# Patient Record
Sex: Male | Born: 1985 | Race: White | Hispanic: No | Marital: Married | Smoking: Former smoker
Health system: Southern US, Community
[De-identification: ages and names within clinical notes are randomized; demographics above are authoritative.]

## PROBLEM LIST (undated history)

## (undated) DIAGNOSIS — R51 Headache: Secondary | ICD-10-CM

## (undated) DIAGNOSIS — R519 Headache, unspecified: Secondary | ICD-10-CM

## (undated) DIAGNOSIS — G4730 Sleep apnea, unspecified: Secondary | ICD-10-CM

## (undated) DIAGNOSIS — I639 Cerebral infarction, unspecified: Secondary | ICD-10-CM

## (undated) DIAGNOSIS — E785 Hyperlipidemia, unspecified: Secondary | ICD-10-CM

## (undated) DIAGNOSIS — G5603 Carpal tunnel syndrome, bilateral upper limbs: Secondary | ICD-10-CM

## (undated) DIAGNOSIS — T7840XA Allergy, unspecified, initial encounter: Secondary | ICD-10-CM

## (undated) DIAGNOSIS — K219 Gastro-esophageal reflux disease without esophagitis: Secondary | ICD-10-CM

## (undated) DIAGNOSIS — E119 Type 2 diabetes mellitus without complications: Secondary | ICD-10-CM

## (undated) DIAGNOSIS — I10 Essential (primary) hypertension: Secondary | ICD-10-CM

## (undated) DIAGNOSIS — G5710 Meralgia paresthetica, unspecified lower limb: Secondary | ICD-10-CM

## (undated) HISTORY — DX: Hyperlipidemia, unspecified: E78.5

## (undated) HISTORY — DX: Type 2 diabetes mellitus without complications: E11.9

## (undated) HISTORY — DX: Carpal tunnel syndrome, bilateral upper limbs: G56.03

## (undated) HISTORY — DX: Essential (primary) hypertension: I10

## (undated) HISTORY — DX: Cerebral infarction, unspecified: I63.9

## (undated) HISTORY — DX: Meralgia paresthetica, unspecified lower limb: G57.10

## (undated) HISTORY — DX: Allergy, unspecified, initial encounter: T78.40XA

---
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
## 2003-01-09 ENCOUNTER — Encounter: Admission: RE | Admit: 2003-01-09 | Discharge: 2003-01-09 | Payer: Self-pay | Admitting: Family Medicine

## 2003-03-28 ENCOUNTER — Encounter: Admission: RE | Admit: 2003-03-28 | Discharge: 2003-03-28 | Payer: Self-pay | Admitting: Family Medicine

## 2003-04-20 ENCOUNTER — Emergency Department (HOSPITAL_COMMUNITY): Admission: EM | Admit: 2003-04-20 | Discharge: 2003-04-20 | Payer: Self-pay | Admitting: Emergency Medicine

## 2003-05-17 ENCOUNTER — Encounter: Admission: RE | Admit: 2003-05-17 | Discharge: 2003-05-17 | Payer: Self-pay | Admitting: Family Medicine

## 2003-07-08 ENCOUNTER — Encounter: Admission: RE | Admit: 2003-07-08 | Discharge: 2003-07-08 | Payer: Self-pay | Admitting: Family Medicine

## 2003-07-31 ENCOUNTER — Encounter: Admission: RE | Admit: 2003-07-31 | Discharge: 2003-07-31 | Payer: Self-pay | Admitting: Family Medicine

## 2003-09-18 ENCOUNTER — Encounter: Admission: RE | Admit: 2003-09-18 | Discharge: 2003-09-18 | Payer: Self-pay | Admitting: Family Medicine

## 2003-11-08 ENCOUNTER — Encounter: Admission: RE | Admit: 2003-11-08 | Discharge: 2003-11-08 | Payer: Self-pay | Admitting: Family Medicine

## 2003-12-05 ENCOUNTER — Encounter: Admission: RE | Admit: 2003-12-05 | Discharge: 2003-12-05 | Payer: Self-pay | Admitting: Family Medicine

## 2004-04-24 ENCOUNTER — Encounter: Admission: RE | Admit: 2004-04-24 | Discharge: 2004-04-24 | Payer: Self-pay | Admitting: Family Medicine

## 2004-05-06 ENCOUNTER — Encounter: Admission: RE | Admit: 2004-05-06 | Discharge: 2004-05-06 | Payer: Self-pay | Admitting: Sports Medicine

## 2004-07-22 ENCOUNTER — Ambulatory Visit: Payer: Self-pay | Admitting: Family Medicine

## 2004-08-10 ENCOUNTER — Ambulatory Visit: Payer: Self-pay | Admitting: Family Medicine

## 2004-08-11 ENCOUNTER — Emergency Department (HOSPITAL_COMMUNITY): Admission: EM | Admit: 2004-08-11 | Discharge: 2004-08-11 | Payer: Self-pay | Admitting: Family Medicine

## 2004-08-14 ENCOUNTER — Emergency Department (HOSPITAL_COMMUNITY): Admission: EM | Admit: 2004-08-14 | Discharge: 2004-08-15 | Payer: Self-pay | Admitting: Emergency Medicine

## 2004-10-15 ENCOUNTER — Ambulatory Visit: Payer: Self-pay | Admitting: Family Medicine

## 2004-11-10 ENCOUNTER — Ambulatory Visit: Payer: Self-pay | Admitting: Family Medicine

## 2004-11-27 ENCOUNTER — Ambulatory Visit: Payer: Self-pay | Admitting: Family Medicine

## 2004-12-02 ENCOUNTER — Ambulatory Visit: Payer: Self-pay | Admitting: Family Medicine

## 2005-01-12 ENCOUNTER — Ambulatory Visit: Payer: Self-pay | Admitting: Family Medicine

## 2005-01-21 ENCOUNTER — Ambulatory Visit (HOSPITAL_COMMUNITY): Admission: RE | Admit: 2005-01-21 | Discharge: 2005-01-21 | Payer: Self-pay | Admitting: Family Medicine

## 2005-02-10 ENCOUNTER — Ambulatory Visit: Payer: Self-pay | Admitting: Family Medicine

## 2005-03-18 ENCOUNTER — Ambulatory Visit: Payer: Self-pay | Admitting: Family Medicine

## 2005-07-19 ENCOUNTER — Ambulatory Visit: Payer: Self-pay | Admitting: Family Medicine

## 2006-05-06 ENCOUNTER — Emergency Department (HOSPITAL_COMMUNITY): Admission: EM | Admit: 2006-05-06 | Discharge: 2006-05-06 | Payer: Self-pay | Admitting: Family Medicine

## 2006-06-21 ENCOUNTER — Ambulatory Visit: Payer: Self-pay | Admitting: Family Medicine

## 2006-07-05 ENCOUNTER — Ambulatory Visit: Payer: Self-pay | Admitting: Sports Medicine

## 2006-12-01 DIAGNOSIS — I10 Essential (primary) hypertension: Secondary | ICD-10-CM

## 2006-12-01 DIAGNOSIS — E669 Obesity, unspecified: Secondary | ICD-10-CM | POA: Insufficient documentation

## 2007-02-06 ENCOUNTER — Telehealth (INDEPENDENT_AMBULATORY_CARE_PROVIDER_SITE_OTHER): Payer: Self-pay | Admitting: *Deleted

## 2007-02-06 ENCOUNTER — Ambulatory Visit: Payer: Self-pay | Admitting: Family Medicine

## 2007-02-07 ENCOUNTER — Telehealth (INDEPENDENT_AMBULATORY_CARE_PROVIDER_SITE_OTHER): Payer: Self-pay | Admitting: *Deleted

## 2007-03-09 ENCOUNTER — Ambulatory Visit: Payer: Self-pay | Admitting: Family Medicine

## 2007-03-09 ENCOUNTER — Encounter (INDEPENDENT_AMBULATORY_CARE_PROVIDER_SITE_OTHER): Payer: Self-pay | Admitting: Family Medicine

## 2007-03-09 DIAGNOSIS — L909 Atrophic disorder of skin, unspecified: Secondary | ICD-10-CM | POA: Insufficient documentation

## 2007-03-09 DIAGNOSIS — L919 Hypertrophic disorder of the skin, unspecified: Secondary | ICD-10-CM

## 2007-03-09 LAB — CONVERTED CEMR LAB
BUN: 14 mg/dL (ref 6–23)
CO2: 26 meq/L (ref 19–32)
Calcium: 9.6 mg/dL (ref 8.4–10.5)
Chloride: 101 meq/L (ref 96–112)
Creatinine, Ser: 0.86 mg/dL (ref 0.40–1.50)
Glucose, Bld: 89 mg/dL (ref 70–99)
Potassium: 4.5 meq/L (ref 3.5–5.3)
Sodium: 139 meq/L (ref 135–145)

## 2007-06-06 ENCOUNTER — Ambulatory Visit: Payer: Self-pay | Admitting: Family Medicine

## 2007-06-06 ENCOUNTER — Telehealth (INDEPENDENT_AMBULATORY_CARE_PROVIDER_SITE_OTHER): Payer: Self-pay | Admitting: *Deleted

## 2007-06-06 ENCOUNTER — Encounter (INDEPENDENT_AMBULATORY_CARE_PROVIDER_SITE_OTHER): Payer: Self-pay | Admitting: Family Medicine

## 2007-07-13 ENCOUNTER — Ambulatory Visit: Payer: Self-pay | Admitting: Family Medicine

## 2007-07-13 ENCOUNTER — Encounter: Payer: Self-pay | Admitting: *Deleted

## 2007-07-13 DIAGNOSIS — B86 Scabies: Secondary | ICD-10-CM

## 2007-08-11 ENCOUNTER — Emergency Department (HOSPITAL_COMMUNITY): Admission: EM | Admit: 2007-08-11 | Discharge: 2007-08-11 | Payer: Self-pay | Admitting: Family Medicine

## 2007-12-12 ENCOUNTER — Telehealth: Payer: Self-pay | Admitting: *Deleted

## 2008-01-15 ENCOUNTER — Ambulatory Visit: Payer: Self-pay | Admitting: Sports Medicine

## 2008-01-15 ENCOUNTER — Encounter (INDEPENDENT_AMBULATORY_CARE_PROVIDER_SITE_OTHER): Payer: Self-pay | Admitting: Family Medicine

## 2008-01-15 DIAGNOSIS — E785 Hyperlipidemia, unspecified: Secondary | ICD-10-CM | POA: Insufficient documentation

## 2008-01-15 LAB — CONVERTED CEMR LAB
Bilirubin Urine: NEGATIVE
Blood in Urine, dipstick: NEGATIVE
Ketones, urine, test strip: NEGATIVE
Nitrite: NEGATIVE
Urobilinogen, UA: 0.2

## 2008-01-16 LAB — CONVERTED CEMR LAB
ALT: 28 units/L (ref 0–53)
Albumin: 4.7 g/dL (ref 3.5–5.2)
Alkaline Phosphatase: 78 units/L (ref 39–117)
CO2: 24 meq/L (ref 19–32)
Glucose, Bld: 91 mg/dL (ref 70–99)
LDL Cholesterol: 178 mg/dL — ABNORMAL HIGH (ref 0–99)
Potassium: 4.5 meq/L (ref 3.5–5.3)
Pro B Natriuretic peptide (BNP): 19 pg/mL (ref 0.0–100.0)
Sodium: 140 meq/L (ref 135–145)
Total Bilirubin: 0.5 mg/dL (ref 0.3–1.2)
Total Protein: 7.7 g/dL (ref 6.0–8.3)
Triglycerides: 161 mg/dL — ABNORMAL HIGH (ref <150)
VLDL: 32 mg/dL (ref 0–40)

## 2008-02-01 ENCOUNTER — Ambulatory Visit: Payer: Self-pay | Admitting: Family Medicine

## 2008-02-02 ENCOUNTER — Encounter (INDEPENDENT_AMBULATORY_CARE_PROVIDER_SITE_OTHER): Payer: Self-pay | Admitting: Family Medicine

## 2008-02-23 ENCOUNTER — Telehealth: Payer: Self-pay | Admitting: *Deleted

## 2008-03-13 ENCOUNTER — Encounter (INDEPENDENT_AMBULATORY_CARE_PROVIDER_SITE_OTHER): Payer: Self-pay | Admitting: Family Medicine

## 2008-03-13 ENCOUNTER — Ambulatory Visit: Payer: Self-pay | Admitting: Family Medicine

## 2008-03-14 LAB — CONVERTED CEMR LAB
Calcium: 9.7 mg/dL (ref 8.4–10.5)
Direct LDL: 196 mg/dL — ABNORMAL HIGH
Glucose, Bld: 87 mg/dL (ref 70–99)
Potassium: 4.1 meq/L (ref 3.5–5.3)
Sodium: 140 meq/L (ref 135–145)

## 2008-04-02 ENCOUNTER — Telehealth (INDEPENDENT_AMBULATORY_CARE_PROVIDER_SITE_OTHER): Payer: Self-pay | Admitting: *Deleted

## 2008-04-11 ENCOUNTER — Encounter (INDEPENDENT_AMBULATORY_CARE_PROVIDER_SITE_OTHER): Payer: Self-pay | Admitting: Family Medicine

## 2008-04-22 ENCOUNTER — Telehealth (INDEPENDENT_AMBULATORY_CARE_PROVIDER_SITE_OTHER): Payer: Self-pay | Admitting: *Deleted

## 2008-05-22 ENCOUNTER — Telehealth: Payer: Self-pay | Admitting: *Deleted

## 2008-05-23 ENCOUNTER — Ambulatory Visit: Payer: Self-pay | Admitting: Family Medicine

## 2008-06-12 ENCOUNTER — Telehealth: Payer: Self-pay | Admitting: *Deleted

## 2008-06-12 ENCOUNTER — Ambulatory Visit: Payer: Self-pay | Admitting: Family Medicine

## 2008-06-24 ENCOUNTER — Ambulatory Visit: Payer: Self-pay | Admitting: Family Medicine

## 2008-09-19 ENCOUNTER — Emergency Department (HOSPITAL_COMMUNITY): Admission: EM | Admit: 2008-09-19 | Discharge: 2008-09-20 | Payer: Self-pay | Admitting: Emergency Medicine

## 2008-09-21 ENCOUNTER — Telehealth: Payer: Self-pay | Admitting: Family Medicine

## 2008-09-23 ENCOUNTER — Ambulatory Visit: Payer: Self-pay | Admitting: Family Medicine

## 2009-07-12 ENCOUNTER — Encounter (INDEPENDENT_AMBULATORY_CARE_PROVIDER_SITE_OTHER): Payer: Self-pay | Admitting: *Deleted

## 2009-07-12 DIAGNOSIS — F17200 Nicotine dependence, unspecified, uncomplicated: Secondary | ICD-10-CM | POA: Insufficient documentation

## 2009-07-15 ENCOUNTER — Encounter: Payer: Self-pay | Admitting: Family Medicine

## 2009-07-15 ENCOUNTER — Ambulatory Visit: Payer: Self-pay | Admitting: Family Medicine

## 2009-07-15 LAB — CONVERTED CEMR LAB
Bilirubin Urine: NEGATIVE
Blood in Urine, dipstick: NEGATIVE
Glucose, Urine, Semiquant: NEGATIVE
Ketones, urine, test strip: NEGATIVE
Specific Gravity, Urine: 1.02
pH: 7

## 2009-07-21 ENCOUNTER — Encounter: Payer: Self-pay | Admitting: Family Medicine

## 2009-07-21 LAB — CONVERTED CEMR LAB
ALT: 33 units/L (ref 0–53)
AST: 22 units/L (ref 0–37)
Albumin: 4.7 g/dL (ref 3.5–5.2)
Alkaline Phosphatase: 75 units/L (ref 39–117)
Glucose, Bld: 78 mg/dL (ref 70–99)
LDL Cholesterol: 184 mg/dL — ABNORMAL HIGH (ref 0–99)
Potassium: 4.5 meq/L (ref 3.5–5.3)
Sodium: 137 meq/L (ref 135–145)
Total Bilirubin: 0.4 mg/dL (ref 0.3–1.2)
Total Protein: 7.6 g/dL (ref 6.0–8.3)
Triglycerides: 203 mg/dL — ABNORMAL HIGH (ref <150)
VLDL: 41 mg/dL — ABNORMAL HIGH (ref 0–40)

## 2009-12-23 ENCOUNTER — Ambulatory Visit: Payer: Self-pay | Admitting: Family Medicine

## 2010-09-23 ENCOUNTER — Emergency Department (HOSPITAL_COMMUNITY)
Admission: EM | Admit: 2010-09-23 | Discharge: 2010-09-23 | Payer: Self-pay | Source: Home / Self Care | Admitting: Emergency Medicine
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
## 2011-02-19 NOTE — Op Note (Signed)
[place]. [facility]  Patient:    Jesse Cook                           MRN: [id number] Proc. Date: 08/19/00 Adm. Date:  [id number] Disc. Date: [id number] Attending:  Annamarie Dawley                           Operative Report  PREOPERATIVE DIAGNOSIS:  Persistent firm right submental lymph node.  POSTOPERATIVE DIAGNOSIS:  Excision right deep submental lymph node.  POSTOPERATIVE DIAGNOSIS:  Pending histological evaluation.  DESCRIPTION OF PROCEDURE:  With the patient under general orotracheal anesthesia, neck, lower face, upper chest was prepped and draped in a sterile fashion.  The patient had firm, freely mobile, palpable 1.5-2 cm node to the right of the midline superior to the hyoid in the submental area.  Midline free margin and mandible were marked and a horizontal incision parallel to the free margin of the mandible was marked and made through skin and subcutaneous tissue overlying the palpable nodule.  An incision carried through significant subcutaneous fat and through the mylohyoid muscle with the node being deep to mylohyoid.  A smooth-surfaced, pale tan node was encountered and dissected free and removed.  Hemostasis kept complete with 4-0 silk ties and electrocautery as indicated.  The wound was then closed in layers with interrupted 4-0 chromic catgut and skin approximated with a running 4-0 nylon subcuticular closure.  Skin around the incision was painted with benzoin and a Steri-Strip applied.  Blood loss for the procedure was less than 30 cc.  The patient tolerated the procedure well and was taken to the recovery room in stable general condition. DD:  08/19/00 TD:  08/19/00 Job: [id number]

## 2011-07-08 LAB — POCT I-STAT, CHEM 8
BUN: 10 mg/dL (ref 6–23)
Calcium, Ion: 1.07 mmol/L — ABNORMAL LOW (ref 1.12–1.32)
Chloride: 103 mEq/L (ref 96–112)
HCT: 45 % (ref 39.0–52.0)
Sodium: 135 mEq/L (ref 135–145)

## 2011-07-22 ENCOUNTER — Ambulatory Visit (INDEPENDENT_AMBULATORY_CARE_PROVIDER_SITE_OTHER): Payer: Managed Care, Other (non HMO) | Admitting: Family Medicine

## 2011-07-22 VITALS — BP 180/126 | HR 68 | Temp 97.5°F | Ht 66.0 in | Wt 298.3 lb

## 2011-07-22 DIAGNOSIS — M545 Low back pain, unspecified: Secondary | ICD-10-CM

## 2011-07-22 DIAGNOSIS — R209 Unspecified disturbances of skin sensation: Secondary | ICD-10-CM

## 2011-07-22 DIAGNOSIS — R202 Paresthesia of skin: Secondary | ICD-10-CM

## 2011-07-22 DIAGNOSIS — M79609 Pain in unspecified limb: Secondary | ICD-10-CM | POA: Insufficient documentation

## 2011-07-22 DIAGNOSIS — Z23 Encounter for immunization: Secondary | ICD-10-CM

## 2011-07-22 DIAGNOSIS — E785 Hyperlipidemia, unspecified: Secondary | ICD-10-CM

## 2011-07-22 DIAGNOSIS — I10 Essential (primary) hypertension: Secondary | ICD-10-CM

## 2011-07-22 MED ORDER — METOPROLOL TARTRATE 25 MG PO TABS: 25.0000 mg | ORAL_TABLET | Freq: Two times a day (BID) | ORAL | Status: DC

## 2011-07-22 MED ORDER — SIMVASTATIN 40 MG PO TABS: 40.0000 mg | ORAL_TABLET | Freq: Every day | ORAL | Status: DC

## 2011-07-22 NOTE — Assessment & Plan Note (Signed)
Mild intermittent low back pain that is not requiring medications at this time. Advised patient of how to properly use Tylenol with intermittent use of ibuprofen as needed. Discussed the long-term goal of weight loss and exercise to help prevent further complications. Discussed role of physical therapy in the future.

## 2011-07-22 NOTE — Progress Notes (Signed)
  Subjective:    Patient ID: Jesse Cook, male    DOB: Jul 23, 1986, 25 y.o.   MRN: [id number]  HPI patient is a 25 year old male who presents for a same-day appointment for one week of left thigh numbness and tingling.  Left thigh numbness: Patient states onset about a week ago with no known trauma, changes in physical activity. He describes it as not so much a pain but more as a numbness and tingling over the antero-lateral surface of his left thigh. He denies any weakness, redness, edema. He slso notes a history of low back pain and wonders if this could be related.    hypertension: At today's visit it was noted that patient's blood pressure was markedly elevated. Patient states this is also true for home blood pressure readings. He has been taking his lisinopril hydrochlorothiazide regularly. He denies any chest pain or dyspnea.  Low back pain: Patient staandtes chronic intermittent low back pain without any radiation or radiculopathy. He has never been evaluated for this before and does not take any regular medications for this. Review of Systems see history of present illnes  I have reviewed patient's  PMH, FH, and Social history and Medications as related to this visit.     Objective:   Physical Exam GEN: Alert & Oriented, No acute distress CV:  Regular Rate & Rhythm, no murmur Respiratory:  Normal work of breathing, CTAB Abd:  + BS, soft, no tenderness to palpation Ext: no pre-tibial edema Right thigh:  paraesthesias to light touch over left anterolateral surface of leg.  No edema or erythema. Back Exam:  Mild pain lumbar paraspinal.  Neg faber.  Neg straight leg raise. NEURO:  Patellar reflexes 2+ bilaterally.        Assessment & Plan:

## 2011-07-22 NOTE — Assessment & Plan Note (Addendum)
Will add metoprolol today for very elevated blood pressure. Patient will see his PCP in 1-2 weeks for further titration

## 2011-07-22 NOTE — Patient Instructions (Signed)
The leg numbness you are noticing can be coming from meralgia parasthetica: nerve compression and irrtation.  This generally resolves on its own.  Try to avoid tight fitting clothes, losing weight can also help.  I think it is less likely this is coming from your back.  Consider back exercises, physical therapy to help prevent ongoing back problems  Will add a blood pressure medicine today: metoprolol  Keep follow-up with PCp in 1-2 weeks

## 2011-07-22 NOTE — Assessment & Plan Note (Signed)
I have refilled his statin today. He will see his PCP for followup and lab work.

## 2011-07-22 NOTE — Assessment & Plan Note (Signed)
I suspect this is meralgia paresthetica, no clothign restriction, risk factors of obesity.  Advised monitoring for now.  Less likely related to lowback pain.

## 2011-07-29 ENCOUNTER — Encounter: Payer: Self-pay | Admitting: Family Medicine

## 2011-07-29 ENCOUNTER — Ambulatory Visit (INDEPENDENT_AMBULATORY_CARE_PROVIDER_SITE_OTHER): Payer: Managed Care, Other (non HMO) | Admitting: Family Medicine

## 2011-07-29 VITALS — BP 159/97 | HR 90 | Ht 64.75 in | Wt 300.3 lb

## 2011-07-29 DIAGNOSIS — I10 Essential (primary) hypertension: Secondary | ICD-10-CM

## 2011-07-29 DIAGNOSIS — F17200 Nicotine dependence, unspecified, uncomplicated: Secondary | ICD-10-CM

## 2011-07-29 DIAGNOSIS — R202 Paresthesia of skin: Secondary | ICD-10-CM

## 2011-07-29 DIAGNOSIS — R209 Unspecified disturbances of skin sensation: Secondary | ICD-10-CM

## 2011-07-29 DIAGNOSIS — Z0000 Encounter for general adult medical examination without abnormal findings: Secondary | ICD-10-CM

## 2011-07-29 MED ORDER — NORTRIPTYLINE HCL 25 MG PO CAPS: 25.0000 mg | ORAL_CAPSULE | Freq: Every day | ORAL | Status: DC

## 2011-07-29 NOTE — Assessment & Plan Note (Signed)
Uses dip. Currently unwilling to quit

## 2011-07-29 NOTE — Assessment & Plan Note (Signed)
Obese hypertensive and oral tobacco user. These problems are discussed separately. Health maintenance and historically information reviewed and updated. Plan to work on weight loss aggressively this year. Followup in one month

## 2011-07-29 NOTE — Patient Instructions (Signed)
Thank you for coming in today. Start taking both the Metoporol and lisinopril/HCTZ Take the nortriptline at night. It will make you sleepy and my have a dry mouth at first.  This will start to help your leg.  I would recommend suspenders.  Get an appointment with Arlys John for weight loss discussion. This is not an additional cost to you as you need to see the doctor a bit at first.  See me in 1 month.

## 2011-07-29 NOTE — Progress Notes (Signed)
Jesse Cook  presents to clinic today for a well adult visit. He was seen by Dr. Earnest Cook 2 weeks ago for hypertension and lateral femoral cutaneous nerve syndrome. In the interim he got confused and stopped taking his lisinopril hydrochlorothiazide and started taking metoprolol 25 2 times a day. He denies any chest pain syncope palpitations dyspnea on exertion or edema.  He does note that he continues to have painful tingly numb sensation on the lateral/anterior aspect of his left thigh. It is quite bothersome for him. He notes that it often interferes with his ability to fall asleep.  He denies any weakness, bowel or bladder dysfunction.  PMH, surgical history, social history, family history reviewed and updated. ROS as above otherwise neg Medications reviewed.  Health maintenance reviewed and updated.  Exam:  BP 159/97  Pulse 90  Ht 5' 4.75" (1.645 m)  Wt 300 lb 4.8 oz (136.215 kg)  BMI 50.36 kg/m2 Gen: Well NAD HEENT: EOMI,  MMM, large neck Lungs: CTABL Nl WOB Heart: RRR no MRG Abd: NABS, NT, ND, obese. Pains are tight at the belt Exts: Non edematous BL  LE, warm and well perfused.  Neuro: Alert and oriented x3. Decreased sensation on the left lateral thigh. Normal reflexes in patellar, and ankles bilaterally. Normal gait. Able to get on exam table by himself.

## 2011-07-29 NOTE — Assessment & Plan Note (Signed)
Continues to be elevated. Currently only on metoprolol due to some confusion. Plan to add back lisinopril/hydrochlorothiazide. Followup in one month

## 2011-07-29 NOTE — Assessment & Plan Note (Signed)
Quite bothersome for Jesse Cook. Advice of behavior modification such as using suspenders losing weight.   I will treat with nortriptyline at night. If not improving will increase dose. Followup in one month

## 2011-08-30 ENCOUNTER — Ambulatory Visit: Payer: Managed Care, Other (non HMO) | Admitting: Family Medicine

## 2011-10-22 ENCOUNTER — Telehealth: Payer: Self-pay | Admitting: Family Medicine

## 2011-10-22 DIAGNOSIS — E669 Obesity, unspecified: Secondary | ICD-10-CM

## 2011-10-22 NOTE — Telephone Encounter (Signed)
Jesse Cook came to the visit today with his wife.  He notes that he'll stop breathing in the middle of the night and is ready to get a sleep study.

## 2011-11-19 ENCOUNTER — Encounter (HOSPITAL_BASED_OUTPATIENT_CLINIC_OR_DEPARTMENT_OTHER): Payer: Managed Care, Other (non HMO)

## 2011-12-24 ENCOUNTER — Ambulatory Visit (HOSPITAL_BASED_OUTPATIENT_CLINIC_OR_DEPARTMENT_OTHER): Payer: 59

## 2011-12-24 ENCOUNTER — Ambulatory Visit (HOSPITAL_BASED_OUTPATIENT_CLINIC_OR_DEPARTMENT_OTHER): Payer: Managed Care, Other (non HMO)

## 2011-12-27 ENCOUNTER — Other Ambulatory Visit: Payer: Self-pay

## 2011-12-27 ENCOUNTER — Emergency Department (HOSPITAL_COMMUNITY)
Admission: EM | Admit: 2011-12-27 | Discharge: 2011-12-27 | Disposition: A | Discharge status: Home / Self Care | Payer: Self-pay | Attending: Emergency Medicine | Admitting: Emergency Medicine

## 2011-12-27 ENCOUNTER — Emergency Department (HOSPITAL_COMMUNITY): Payer: Self-pay

## 2011-12-27 DIAGNOSIS — I10 Essential (primary) hypertension: Secondary | ICD-10-CM | POA: Insufficient documentation

## 2011-12-27 DIAGNOSIS — R112 Nausea with vomiting, unspecified: Secondary | ICD-10-CM | POA: Insufficient documentation

## 2011-12-27 DIAGNOSIS — R0789 Other chest pain: Secondary | ICD-10-CM

## 2011-12-27 DIAGNOSIS — R071 Chest pain on breathing: Secondary | ICD-10-CM | POA: Insufficient documentation

## 2011-12-27 DIAGNOSIS — R079 Chest pain, unspecified: Secondary | ICD-10-CM | POA: Insufficient documentation

## 2011-12-27 DIAGNOSIS — Z79899 Other long term (current) drug therapy: Secondary | ICD-10-CM | POA: Insufficient documentation

## 2011-12-27 DIAGNOSIS — R42 Dizziness and giddiness: Secondary | ICD-10-CM | POA: Insufficient documentation

## 2011-12-27 DIAGNOSIS — E785 Hyperlipidemia, unspecified: Secondary | ICD-10-CM | POA: Insufficient documentation

## 2011-12-27 LAB — DIFFERENTIAL
Basophils Absolute: 0 10*3/uL (ref 0.0–0.1)
Eosinophils Relative: 2 % (ref 0–5)
Lymphocytes Relative: 22 % (ref 12–46)
Lymphs Abs: 1.6 10*3/uL (ref 0.7–4.0)
Monocytes Absolute: 0.5 10*3/uL (ref 0.1–1.0)
Neutro Abs: 5 10*3/uL (ref 1.7–7.7)

## 2011-12-27 LAB — POCT I-STAT, CHEM 8
BUN: 11 mg/dL (ref 6–23)
Calcium, Ion: 1.29 mmol/L (ref 1.12–1.32)
Hemoglobin: 15 g/dL (ref 13.0–17.0)
Sodium: 139 mEq/L (ref 135–145)
TCO2: 28 mmol/L (ref 0–100)

## 2011-12-27 LAB — CBC
HCT: 43.5 % (ref 39.0–52.0)
MCV: 86 fL (ref 78.0–100.0)
Platelets: 199 10*3/uL (ref 150–400)
RBC: 5.06 MIL/uL (ref 4.22–5.81)
RDW: 12.5 % (ref 11.5–15.5)
WBC: 7.3 10*3/uL (ref 4.0–10.5)

## 2011-12-27 LAB — CARDIAC PANEL(CRET KIN+CKTOT+MB+TROPI)
Relative Index: 1.7 (ref 0.0–2.5)
Total CK: 151 U/L (ref 7–232)
Troponin I: <0.3 ng/mL (ref <0.30)

## 2011-12-27 MED ORDER — SODIUM CHLORIDE 0.9 % IV SOLN
Freq: Once | INTRAVENOUS | Status: AC
  Administered 2011-12-27: 03:00:00 via INTRAVENOUS

## 2011-12-27 MED ORDER — PANTOPRAZOLE SODIUM 40 MG IV SOLR
40.0000 mg | Freq: Once | INTRAVENOUS | Status: AC
  Administered 2011-12-27: 40 mg via INTRAVENOUS
  Filled 2011-12-27: qty 40

## 2011-12-27 MED ORDER — ASPIRIN 81 MG PO CHEW
324.0000 mg | CHEWABLE_TABLET | Freq: Once | ORAL | Status: AC
  Administered 2011-12-27: 324 mg via ORAL
  Filled 2011-12-27: qty 4

## 2011-12-27 NOTE — ED Provider Notes (Signed)
History     CSN: [id number]  Arrival date & time 12/27/11  0009   First MD Initiated Contact with Patient 12/27/11 0130      Chief Complaint  Patient presents with  . Chest Pain    25 minutes ago,  nausea vomiting,  pain shooting down left arm, history htn,  high cholesterol    (Consider location/radiation/quality/duration/timing/severity/associated sxs/prior treatment) HPI Comments: Patient states he's been out of his blood pressure medicine for 2 weeks.  He took one dose of metoprolol tonight while he was sitting in a recliner.  He became dizzy and nauseated.  His wife helped him to the bedroom proceeded to run through resting and vomited this did not relieve his symptoms of nausea, chest pain, and dizziness  The history is provided by the patient.    Past Medical History  Diagnosis Date  . Hypertension   . Hyperlipidemia   . Lateral femoral cutaneous neuropathy     Past Surgical History  Procedure Date  . Lymph node biopsy     Family History  Problem Relation Age of Onset  . Diabetes Mother   . COPD Mother   . Heart disease Father   . Hypertension Father   . Diabetes Father   . Hyperlipidemia Father   . Heart disease Brother   . Kidney disease Brother     History  Substance Use Topics  . Smoking status: Former Games developer  . Smokeless tobacco: Current User    Last Attempt to Quit: 07/28/2005  . Alcohol Use: 2.4 oz/week    4 Cans of beer per week      Review of Systems  Constitutional: Negative for fever.  HENT: Negative for congestion.   Respiratory: Negative for cough and shortness of breath.   Cardiovascular: Positive for chest pain. Negative for palpitations and leg swelling.  Gastrointestinal: Positive for nausea and vomiting.  Neurological: Positive for dizziness. Negative for weakness.    Allergies  Review of patient's allergies indicates no known allergies.  Home Medications   Current Outpatient Rx  Name Route Sig Dispense Refill  .  LISINOPRIL-HYDROCHLOROTHIAZIDE 20-25 MG PO TABS Oral Take 1 tablet by mouth daily. for blood pressure and leg swelling     . METOPROLOL TARTRATE 25 MG PO TABS Oral Take 1 tablet (25 mg total) by mouth 2 (two) times daily. 60 tablet 0  . OMEPRAZOLE 20 MG PO TBEC Oral Take 1 tablet by mouth every morning.     Marland Kitchen SIMVASTATIN 40 MG PO TABS Oral Take 1 tablet (40 mg total) by mouth daily. 30 tablet 5    BP 168/103  Pulse 73  Temp(Src) 98.8 F (37.1 C) (Oral)  Resp 22  SpO2 100%  Physical Exam  Constitutional: He appears well-developed and well-nourished.  HENT:  Head: Normocephalic.  Neck: Normal range of motion.  Cardiovascular: Normal rate and regular rhythm.   Pulmonary/Chest: Effort normal and breath sounds normal. No accessory muscle usage. No respiratory distress. He has no decreased breath sounds. He has no wheezes. He exhibits tenderness.       Reproducible pain across upper chest just under her clavicles bilaterally  Abdominal: Soft. There is no tenderness.  Musculoskeletal: Normal range of motion.  Neurological: He is alert.  Skin: Skin is warm and dry. No pallor.    ED Course  Procedures (including critical care time)   Labs Reviewed  CBC  DIFFERENTIAL  CARDIAC PANEL(CRET KIN+CKTOT+MB+TROPI)   No results found.   No diagnosis found.  ED ECG  REPORT   Date: 12/27/2011  EKG Time: 1:48 AM  Rate: 73  Rhythm: normal sinus rhythm,  there are no previous tracings available for comparison  Axis: right  Intervals:none  ST&T Change: none  Narrative Interpretation: normal             MDM  .This is cardiac in nature.  Patient does have a history of GERD.  He did take one dose of his antihypertensive just before he became dizzy had been off these medicines for almost 2 weeks before refilling his prescription        Arman Filter, NP 12/27/11 [phone]

## 2011-12-27 NOTE — ED Notes (Signed)
Patient given discharge instructions, information, prescriptions, and diet order. Patient states that they adequately understand discharge information given and to return to ED if symptoms return or worsen.    Pt informed to follow up with regular MD regarding cardiac hx and new symptoms.

## 2011-12-27 NOTE — Discharge Instructions (Signed)
Chest Pain (Nonspecific) It is often hard to give a specific diagnosis for the cause of chest pain. There is always a chance that your pain could be related to something serious, such as a heart attack or a blood clot in the lungs. You need to follow up with your caregiver for further evaluation. CAUSES   Heartburn.   Pneumonia or bronchitis.   Anxiety or stress.   Inflammation around your heart (pericarditis) or lung (pleuritis or pleurisy).   A blood clot in the lung.   A collapsed lung (pneumothorax). It can develop suddenly on its own (spontaneous pneumothorax) or from injury (trauma) to the chest.   Shingles infection (herpes zoster virus).  The chest wall is composed of bones, muscles, and cartilage. Any of these can be the source of the pain.  The bones can be bruised by injury.   The muscles or cartilage can be strained by coughing or overwork.   The cartilage can be affected by inflammation and become sore (costochondritis).  DIAGNOSIS  Lab tests or other studies, such as X-rays, electrocardiography, stress testing, or cardiac imaging, may be needed to find the cause of your pain.  TREATMENT   Treatment depends on what may be causing your chest pain. Treatment may include:   Acid blockers for heartburn.   Anti-inflammatory medicine.   Pain medicine for inflammatory conditions.   Antibiotics if an infection is present.   You may be advised to change lifestyle habits. This includes stopping smoking and avoiding alcohol, caffeine, and chocolate.   You may be advised to keep your head raised (elevated) when sleeping. This reduces the chance of acid going backward from your stomach into your esophagus.   Most of the time, nonspecific chest pain will improve within 2 to 3 days with rest and mild pain medicine.  HOME CARE INSTRUCTIONS   If antibiotics were prescribed, take your antibiotics as directed. Finish them even if you start to feel better.   For the next few  days, avoid physical activities that bring on chest pain. Continue physical activities as directed.   Do not smoke.   Avoid drinking alcohol.   Only take over-the-counter or prescription medicine for pain, discomfort, or fever as directed by your caregiver.   Follow your caregiver's suggestions for further testing if your chest pain does not go away.   Keep any follow-up appointments you made. If you do not go to an appointment, you could develop lasting (chronic) problems with pain. If there is any problem keeping an appointment, you must call to reschedule.  SEEK MEDICAL CARE IF:   You think you are having problems from the medicine you are taking. Read your medicine instructions carefully.   Your chest pain does not go away, even after treatment.   You develop a rash with blisters on your chest.  SEEK IMMEDIATE MEDICAL CARE IF:   You have increased chest pain or pain that spreads to your arm, neck, jaw, back, or abdomen.   You develop shortness of breath, an increasing cough, or you are coughing up blood.   You have severe back or abdominal pain, feel nauseous, or vomit.   You develop severe weakness, fainting, or chills.   You have a fever.  THIS IS AN EMERGENCY. Do not wait to see if the pain will go away. Get medical help at once. Call your local emergency services (911 in [place] Do not drive yourself to the hospital. MAKE SURE YOU:   Understand these instructions.     Will watch your condition.   Will get help right away if you are not doing well or get worse.  Document Released: 06/30/2005 Document Revised: 09/09/2011 Document Reviewed: 04/25/2008 [employer] Patient Information 2012 [employer]. 

## 2011-12-27 NOTE — ED Provider Notes (Signed)
Medical screening examination/treatment/procedure(s) were performed by non-physician practitioner and as supervising physician I was immediately available for consultation/collaboration.   Vida Roller, MD 12/27/11 [phone]

## 2011-12-27 NOTE — ED Notes (Signed)
Pt states he was laying in recliner and started getting dizzy, wife assisted him to bed and his chest started hurting he vomited and pain shot down left arm,

## 2012-01-03 ENCOUNTER — Other Ambulatory Visit (HOSPITAL_COMMUNITY): Payer: Self-pay | Admitting: Cardiovascular Disease

## 2012-01-10 ENCOUNTER — Ambulatory Visit (HOSPITAL_COMMUNITY)
Admission: RE | Admit: 2012-01-10 | Discharge: 2012-01-10 | Disposition: A | Discharge status: Home / Self Care | Payer: 59 | Source: Ambulatory Visit | Attending: Cardiovascular Disease | Admitting: Cardiovascular Disease

## 2012-01-10 ENCOUNTER — Encounter (HOSPITAL_COMMUNITY)
Admission: RE | Admit: 2012-01-10 | Discharge: 2012-01-10 | Disposition: A | Discharge status: Home / Self Care | Payer: 59 | Source: Ambulatory Visit | Attending: Cardiovascular Disease | Admitting: Cardiovascular Disease

## 2012-01-10 DIAGNOSIS — R079 Chest pain, unspecified: Secondary | ICD-10-CM | POA: Insufficient documentation

## 2012-01-10 DIAGNOSIS — R0602 Shortness of breath: Secondary | ICD-10-CM | POA: Insufficient documentation

## 2012-01-10 DIAGNOSIS — E785 Hyperlipidemia, unspecified: Secondary | ICD-10-CM | POA: Insufficient documentation

## 2012-01-10 DIAGNOSIS — I10 Essential (primary) hypertension: Secondary | ICD-10-CM | POA: Insufficient documentation

## 2012-01-10 DIAGNOSIS — F17200 Nicotine dependence, unspecified, uncomplicated: Secondary | ICD-10-CM | POA: Insufficient documentation

## 2012-01-10 DIAGNOSIS — E669 Obesity, unspecified: Secondary | ICD-10-CM | POA: Insufficient documentation

## 2012-01-10 MED ORDER — REGADENOSON 0.4 MG/5ML IV SOLN
INTRAVENOUS | Status: AC
  Filled 2012-01-10: qty 5

## 2012-01-10 MED ORDER — TECHNETIUM TC 99M TETROFOSMIN IV KIT
10.0000 | PACK | Freq: Once | INTRAVENOUS | Status: AC | PRN
  Administered 2012-01-10: 10 via INTRAVENOUS

## 2012-01-10 MED ORDER — TECHNETIUM TC 99M TETROFOSMIN IV KIT
30.0000 | PACK | Freq: Once | INTRAVENOUS | Status: AC | PRN
  Administered 2012-01-10: 30 via INTRAVENOUS

## 2012-01-10 MED ORDER — REGADENOSON 0.4 MG/5ML IV SOLN
0.4000 mg | Freq: Once | INTRAVENOUS | Status: AC
  Administered 2012-01-10: 0.4 mg via INTRAVENOUS

## 2012-02-04 ENCOUNTER — Encounter (HOSPITAL_BASED_OUTPATIENT_CLINIC_OR_DEPARTMENT_OTHER): Payer: Self-pay

## 2013-08-16 ENCOUNTER — Encounter (HOSPITAL_COMMUNITY): Payer: Self-pay | Admitting: Emergency Medicine

## 2013-08-16 ENCOUNTER — Emergency Department (HOSPITAL_COMMUNITY): Payer: Self-pay

## 2013-08-16 ENCOUNTER — Inpatient Hospital Stay (HOSPITAL_COMMUNITY)
Admission: EM | Admit: 2013-08-16 | Discharge: 2013-08-17 | DRG: 202 | Disposition: A | Discharge status: Home / Self Care | Payer: Self-pay | Attending: Family Medicine | Admitting: Family Medicine

## 2013-08-16 DIAGNOSIS — Z6841 Body Mass Index (BMI) 40.0 and over, adult: Secondary | ICD-10-CM

## 2013-08-16 DIAGNOSIS — F17200 Nicotine dependence, unspecified, uncomplicated: Secondary | ICD-10-CM | POA: Diagnosis present

## 2013-08-16 DIAGNOSIS — E669 Obesity, unspecified: Secondary | ICD-10-CM

## 2013-08-16 DIAGNOSIS — E785 Hyperlipidemia, unspecified: Secondary | ICD-10-CM | POA: Diagnosis present

## 2013-08-16 DIAGNOSIS — M6281 Muscle weakness (generalized): Secondary | ICD-10-CM

## 2013-08-16 DIAGNOSIS — Z79899 Other long term (current) drug therapy: Secondary | ICD-10-CM

## 2013-08-16 DIAGNOSIS — G589 Mononeuropathy, unspecified: Secondary | ICD-10-CM | POA: Diagnosis present

## 2013-08-16 DIAGNOSIS — R5381 Other malaise: Secondary | ICD-10-CM

## 2013-08-16 DIAGNOSIS — R209 Unspecified disturbances of skin sensation: Secondary | ICD-10-CM

## 2013-08-16 DIAGNOSIS — I10 Essential (primary) hypertension: Secondary | ICD-10-CM | POA: Diagnosis present

## 2013-08-16 DIAGNOSIS — M79609 Pain in unspecified limb: Secondary | ICD-10-CM

## 2013-08-16 DIAGNOSIS — R531 Weakness: Secondary | ICD-10-CM | POA: Diagnosis present

## 2013-08-16 DIAGNOSIS — J40 Bronchitis, not specified as acute or chronic: Principal | ICD-10-CM | POA: Diagnosis present

## 2013-08-16 DIAGNOSIS — R6510 Systemic inflammatory response syndrome (SIRS) of non-infectious origin without acute organ dysfunction: Secondary | ICD-10-CM

## 2013-08-16 DIAGNOSIS — R0789 Other chest pain: Secondary | ICD-10-CM

## 2013-08-16 DIAGNOSIS — Z8249 Family history of ischemic heart disease and other diseases of the circulatory system: Secondary | ICD-10-CM

## 2013-08-16 DIAGNOSIS — J189 Pneumonia, unspecified organism: Secondary | ICD-10-CM

## 2013-08-16 DIAGNOSIS — R29898 Other symptoms and signs involving the musculoskeletal system: Secondary | ICD-10-CM | POA: Diagnosis present

## 2013-08-16 DIAGNOSIS — R000 Tachycardia, unspecified: Secondary | ICD-10-CM | POA: Diagnosis present

## 2013-08-16 HISTORY — DX: Sleep apnea, unspecified: G47.30

## 2013-08-16 HISTORY — DX: Headache: R51

## 2013-08-16 HISTORY — DX: Headache, unspecified: R51.9

## 2013-08-16 HISTORY — DX: Gastro-esophageal reflux disease without esophagitis: K21.9

## 2013-08-16 LAB — RAPID URINE DRUG SCREEN, HOSP PERFORMED
Amphetamines: NOT DETECTED
Barbiturates: NOT DETECTED
Benzodiazepines: NOT DETECTED
Cocaine: NOT DETECTED
Tetrahydrocannabinol: NOT DETECTED

## 2013-08-16 LAB — CBC
HCT: 44.1 % (ref 39.0–52.0)
Hemoglobin: 15.7 g/dL (ref 13.0–17.0)
RBC: 5.09 MIL/uL (ref 4.22–5.81)
WBC: 7.6 10*3/uL (ref 4.0–10.5)

## 2013-08-16 LAB — HEMOGLOBIN A1C: Mean Plasma Glucose: 105 mg/dL (ref <117)

## 2013-08-16 LAB — LIPID PANEL
Cholesterol: 198 mg/dL (ref 0–200)
Total CHOL/HDL Ratio: 5.8 RATIO
VLDL: 16 mg/dL (ref 0–40)

## 2013-08-16 LAB — CBC WITH DIFFERENTIAL/PLATELET
Eosinophils Absolute: 0 10*3/uL (ref 0.0–0.7)
Eosinophils Relative: 0 % (ref 0–5)
Hemoglobin: 15.6 g/dL (ref 13.0–17.0)
Lymphs Abs: 0.9 10*3/uL (ref 0.7–4.0)
MCH: 31.5 pg (ref 26.0–34.0)
MCV: 85.9 fL (ref 78.0–100.0)
Monocytes Relative: 8 % (ref 3–12)
RBC: 4.96 MIL/uL (ref 4.22–5.81)

## 2013-08-16 LAB — POCT I-STAT, CHEM 8
BUN: 10 mg/dL (ref 6–23)
Calcium, Ion: 1.17 mmol/L (ref 1.12–1.23)
Chloride: 100 mEq/L (ref 96–112)
Creatinine, Ser: 1.2 mg/dL (ref 0.50–1.35)
Glucose, Bld: 127 mg/dL — ABNORMAL HIGH (ref 70–99)

## 2013-08-16 LAB — BASIC METABOLIC PANEL
Chloride: 101 mEq/L (ref 96–112)
GFR calc Af Amer: >90 mL/min (ref >90)
Potassium: 3.8 mEq/L (ref 3.5–5.1)
Sodium: 136 mEq/L (ref 135–145)

## 2013-08-16 LAB — TROPONIN I
Troponin I: <0.3 ng/mL (ref <0.30)
Troponin I: <0.3 ng/mL (ref <0.30)

## 2013-08-16 MED ORDER — ASPIRIN 81 MG PO CHEW
324.0000 mg | CHEWABLE_TABLET | Freq: Once | ORAL | Status: AC
  Administered 2013-08-16: 324 mg via ORAL
  Filled 2013-08-16: qty 4

## 2013-08-16 MED ORDER — SODIUM CHLORIDE 0.9 % IV BOLUS (SEPSIS)
1000.0000 mL | Freq: Once | INTRAVENOUS | Status: AC
  Administered 2013-08-16: 1000 mL via INTRAVENOUS

## 2013-08-16 MED ORDER — SODIUM CHLORIDE 0.9 % IV SOLN
INTRAVENOUS | Status: DC
  Administered 2013-08-16: 06:00:00 via INTRAVENOUS

## 2013-08-16 MED ORDER — LEVOFLOXACIN IN D5W 750 MG/150ML IV SOLN
750.0000 mg | Freq: Once | INTRAVENOUS | Status: AC
  Administered 2013-08-16: 750 mg via INTRAVENOUS
  Filled 2013-08-16: qty 150

## 2013-08-16 MED ORDER — ACETAMINOPHEN 325 MG PO TABS: 650.0000 mg | ORAL_TABLET | Freq: Four times a day (QID) | ORAL | Status: DC | PRN

## 2013-08-16 MED ORDER — MORPHINE SULFATE 2 MG/ML IJ SOLN: 2.0000 mg | INTRAMUSCULAR | Status: DC | PRN

## 2013-08-16 MED ORDER — INFLUENZA VAC SPLIT QUAD 0.5 ML IM SUSP
0.5000 mL | INTRAMUSCULAR | Status: AC
  Administered 2013-08-17: 0.5 mL via INTRAMUSCULAR
  Filled 2013-08-16: qty 0.5

## 2013-08-16 MED ORDER — LISINOPRIL-HYDROCHLOROTHIAZIDE 20-25 MG PO TABS: 1.0000 | ORAL_TABLET | Freq: Every day | ORAL | Status: DC

## 2013-08-16 MED ORDER — ATORVASTATIN CALCIUM 80 MG PO TABS
80.0000 mg | ORAL_TABLET | Freq: Every day | ORAL | Status: DC
  Administered 2013-08-16: 80 mg via ORAL
  Filled 2013-08-16 (×2): qty 1

## 2013-08-16 MED ORDER — METOPROLOL TARTRATE 25 MG PO TABS
25.0000 mg | ORAL_TABLET | Freq: Two times a day (BID) | ORAL | Status: DC
  Administered 2013-08-16 – 2013-08-17 (×3): 25 mg via ORAL
  Filled 2013-08-16 (×4): qty 1

## 2013-08-16 MED ORDER — ACETAMINOPHEN 500 MG PO TABS
1000.0000 mg | ORAL_TABLET | Freq: Once | ORAL | Status: AC
  Administered 2013-08-16: 1000 mg via ORAL
  Filled 2013-08-16: qty 2

## 2013-08-16 MED ORDER — SIMVASTATIN 40 MG PO TABS
40.0000 mg | ORAL_TABLET | Freq: Every day | ORAL | Status: DC
  Filled 2013-08-16: qty 1

## 2013-08-16 MED ORDER — HYDROCHLOROTHIAZIDE 25 MG PO TABS
25.0000 mg | ORAL_TABLET | Freq: Every day | ORAL | Status: DC
  Administered 2013-08-16 – 2013-08-17 (×2): 25 mg via ORAL
  Filled 2013-08-16 (×2): qty 1

## 2013-08-16 MED ORDER — HEPARIN SODIUM (PORCINE) 5000 UNIT/ML IJ SOLN
5000.0000 [IU] | Freq: Three times a day (TID) | INTRAMUSCULAR | Status: DC
  Administered 2013-08-16 – 2013-08-17 (×5): 5000 [IU] via SUBCUTANEOUS
  Filled 2013-08-16 (×7): qty 1

## 2013-08-16 MED ORDER — ATORVASTATIN CALCIUM 40 MG PO TABS
40.0000 mg | ORAL_TABLET | Freq: Every day | ORAL | Status: DC
  Filled 2013-08-16 (×2): qty 1

## 2013-08-16 MED ORDER — NITROGLYCERIN 0.4 MG SL SUBL: 0.4000 mg | SUBLINGUAL_TABLET | SUBLINGUAL | Status: DC | PRN

## 2013-08-16 MED ORDER — LISINOPRIL 20 MG PO TABS
20.0000 mg | ORAL_TABLET | Freq: Every day | ORAL | Status: DC
  Administered 2013-08-16 – 2013-08-17 (×2): 20 mg via ORAL
  Filled 2013-08-16 (×2): qty 1

## 2013-08-16 MED ORDER — ACETAMINOPHEN 650 MG RE SUPP: 650.0000 mg | Freq: Four times a day (QID) | RECTAL | Status: DC | PRN

## 2013-08-16 NOTE — ED Notes (Signed)
Patient transported to X-ray 

## 2013-08-16 NOTE — ED Provider Notes (Signed)
CSN: [id number]     Arrival date & time 08/16/13  0101 History   First MD Initiated Contact with Patient 08/16/13 0222     Chief Complaint  Patient presents with  . Chest Pain   (Consider location/radiation/quality/duration/timing/severity/associated sxs/prior Treatment) HPI Comments: 27 yo male with htn, FH cardiac, lipids, obesity presents with chest pain and left sided weakness.  Pt has had left side numb/ weak for 12 hrs, no hx of similar.  Pt has had intermittent cp anterior, ache/ burning since today, similar to previous, no known cardiac hx but multiple risk factors.  No recent surgery or blood clot hx.   Mild cough.    Patient is a 27 y.o. male presenting with chest pain. The history is provided by the patient.  Chest Pain Associated symptoms: numbness and weakness   Associated symptoms: no abdominal pain, no back pain, no fever, no headache, no shortness of breath and not vomiting     Past Medical History  Diagnosis Date  . Hypertension   . Hyperlipidemia   . Lateral femoral cutaneous neuropathy    Past Surgical History  Procedure Laterality Date  . Lymph node biopsy     Family History  Problem Relation Age of Onset  . Diabetes Mother   . COPD Mother   . Heart disease Father   . Hypertension Father   . Diabetes Father   . Hyperlipidemia Father   . Heart disease Brother   . Kidney disease Brother    History  Substance Use Topics  . Smoking status: Former Games developer  . Smokeless tobacco: Current User    Last Attempt to Quit: 07/28/2005  . Alcohol Use: 2.4 oz/week    4 Cans of beer per week    Review of Systems  Constitutional: Negative for fever and chills.  HENT: Negative for congestion.   Eyes: Negative for visual disturbance.  Respiratory: Negative for shortness of breath.   Cardiovascular: Positive for chest pain. Negative for leg swelling.  Gastrointestinal: Negative for vomiting and abdominal pain.  Genitourinary: Negative for dysuria and flank pain.   Musculoskeletal: Negative for back pain, neck pain and neck stiffness.  Skin: Negative for rash.  Neurological: Positive for weakness and numbness. Negative for light-headedness and headaches.    Allergies  Review of patient's allergies indicates no known allergies.  Home Medications   Current Outpatient Rx  Name  Route  Sig  Dispense  Refill  . lisinopril-hydrochlorothiazide (PRINZIDE,ZESTORETIC) 20-25 MG per tablet   Oral   Take 1 tablet by mouth daily. for blood pressure and leg swelling          . metoprolol tartrate (LOPRESSOR) 25 MG tablet   Oral   Take 1 tablet (25 mg total) by mouth 2 (two) times daily.   60 tablet   0   . Omeprazole ([facility] OMEPRAZOLE) 20 MG TBEC   Oral   Take 1 tablet by mouth every morning.          . simvastatin (ZOCOR) 40 MG tablet   Oral   Take 1 tablet (40 mg total) by mouth daily.   30 tablet   5    BP 108/66  Pulse 94  Temp(Src) 100.7 F (38.2 C) (Oral)  Resp 22  Ht 5\' 6"  (1.676 m)  Wt 290 lb (131.543 kg)  BMI 46.83 kg/m2  SpO2 98% Physical Exam  Nursing note and vitals reviewed. Constitutional: He is oriented to person, place, and time. He appears well-developed and well-nourished.  HENT:  Head: Normocephalic and atraumatic.  Dry mm  Eyes: Conjunctivae are normal. Right eye exhibits no discharge. Left eye exhibits no discharge.  Neck: Normal range of motion. Neck supple. No tracheal deviation present.  Cardiovascular: Regular rhythm and intact distal pulses.  Tachycardia present.   Pulmonary/Chest: Effort normal and breath sounds normal.  Abdominal: Soft. He exhibits no distension. There is no tenderness. There is no guarding.  Musculoskeletal: He exhibits no edema and no tenderness.  Neurological: He is alert and oriented to person, place, and time. No cranial nerve deficit. GCS eye subscore is 4. GCS verbal subscore is 5. GCS motor subscore is 6.  Left arm and leg 4+ weakness vs right Sensation intact UE and LE Drift  left arm CNs intact  Skin: Skin is warm. No rash noted.  Psychiatric: He has a normal mood and affect.    ED Course  Procedures (including critical care time) Labs Review Labs Reviewed  CBC WITH DIFFERENTIAL - Abnormal; Notable for the following:    MCHC 36.6 (*)    Neutrophils Relative % 81 (*)    Lymphocytes Relative 11 (*)    All other components within normal limits  POCT I-STAT, CHEM 8 - Abnormal; Notable for the following:    Glucose, Bld 127 (*)    All other components within normal limits  TROPONIN I  TROPONIN I  TROPONIN I  BASIC METABOLIC PANEL  CBC  POCT I-STAT TROPONIN I   Imaging Review Dg Chest 2 View  08/16/2013   CLINICAL DATA:  Chest pain.  EXAM: CHEST  2 VIEW  COMPARISON:  12/27/2011.  FINDINGS: Poor inspiration. Grossly normal sized heart. Clear lungs. Diffuse peribronchial thickening. Unremarkable bones.  IMPRESSION: Moderate bronchitic changes with mild progression.   Electronically Signed   By: Gordan Payment M.D.   On: 08/16/2013 01:44    EKG Interpretation     Ventricular Rate:  134 PR Interval:  146 QRS Duration: 90 QT Interval:  290 QTC Calculation: 433 R Axis:   -82 Text Interpretation:  Sinus tachycardia with occasional Premature ventricular complexes Left anterior fascicular block Abnormal ECG            MDM  No diagnosis found. Chest pain - although young multiple risk factors- plan for cardiac screen likley pneumonia/ bronchitis. Levaquin given.  HR improved with fluids. Tachycardia- dry mm, fever, improved with antipyretics and fluids. Left numbness - risk factors for stroke - CT head.  Admission for further eval, discussed with FP resident.  The patients results and plan were reviewed and discussed.   Any x-rays performed were personally reviewed by myself.   Differential diagnosis were considered with the presenting HPI.  Diagnosis: SIRS, CAP, Left sided weakness  EKG:  LAFR, reviewed  Admission/ observation were  discussed with the admitting physician, patient and/or family and they are comfortable with the plan.      Enid Skeens, MD 08/16/13 [phone]

## 2013-08-16 NOTE — ED Notes (Signed)
Unable to move pt due to cardiology pa at bedside

## 2013-08-16 NOTE — ED Notes (Signed)
Admitting physician at bedside exam pt.

## 2013-08-16 NOTE — H&P (Addendum)
FMTS Attending Note  I personally saw and evaluated the patient. The plan of care was discussed with the resident team. I agree with the assessment and plan as documented by the resident.   27 year old male with past medical history of hypertension, hyperlipidemia, obesity presents for evaluation of substernal chest pain as well as left upper and lower extremity numbness and subjective weakness. Please see resident dictation for history of present illness. Currently rates chest pain as 2/10, pressure type sensation, doesn't radiate to the jaw or to the arm, mild associated shortness of breath, he also currently has a history of numbness and subjective weakness of the left upper and left lower extremity, associated with this as a burning type sensation in these 2 extremities  Vitals: Reviewed General: Pleasant Caucasian male, no acute distress, accompanied by his wife HEENT: Normocephalic, pupils are equal round and reactive to light, extraocular movements are intact, no scleral icterus, moist mucous members, uvula midline, neck was supple Cardiac: Regular rate and rhythm, S1 and S2 present, no murmurs, no heaves or thrills Respiratory: Clear to patient bilaterally, normal effort accidental: Soft, nontender, bowel sounds present Neuro: Cranial nerves II through XII are intact, right upper and right lower extremity muscle testing was 5 of 5, left upper and left lower muscle testing was 4+ out of 5, pronator drift negative, Romberg negative however patient did report some mild dizziness with this maneuver, reflex testing showed 2+ biceps, brachioradialis, and patellar reflexes, sensation to light touch was subjectively decreased to the patient on the left upper and left lower external MSK: No neck tenderness, range of motion of the neck is full Vascular: 2+ radial pulses and dorsalis pedis pulses were appreciated, capillary refill was one to 2 seconds in all extremities  CT head was unremarkable for  acute bleed. Initial EKG in the ER showed tachycardia and left anterior fascicular block. Repeat EKG was within normal limits Chest x-ray showed no acute infiltrate  Assessment and plan: #1. Chest discomfort: Initial cardiac workup has been unremarkable, previous stress test in 2013 was negative for ischemic changes, cardiology will be consulted for their input, Agree with risk stratification as documented in the resident note. Patient history and physical examination as not consistent with aortic dissection however will check bilateral upper extremity blood pressures to evaluate for discrepancies. If abnormal could consider CTA of the chest and abdomen. #2. Left upper and left lower extremity numbness/weakness: Unclear etiology, CT head unremarkable. Will obtain MRI brain to rule out acute stroke. Consider neurology consultation if symptoms persist. #3. Bronchitis: Antibiotics and conservative management at this time #4. Hypertension: Controlled at this time, continue home lisinopril and metoprolol #5. Hyperlipidemia: Agree with high intensity statin therapy  Donnella Sham MD

## 2013-08-16 NOTE — ED Notes (Signed)
Pt reports onset of chest pain around 4pm today with left arm numbness.

## 2013-08-16 NOTE — ED Notes (Signed)
Patient to CT at this time

## 2013-08-16 NOTE — ED Notes (Signed)
Vital signs stable. 

## 2013-08-16 NOTE — Progress Notes (Signed)
PGY-2 Interim Note  On cross-cover call, paged by ED RN with request for order of UDS, which has been placed (young patient with tachycardia and chest pain). Full H&P pending. Reviewed pt's ED notes and placed the order, and communicated this to the day team.  Additionally, I am Mr. Cheek assigned PCP and will plan to see him at bedside. I have not met him before; his previous doctor was Dr. Denyse Amass, who has graduated our program.  Bobbye Morton, MD PGY-2, [facility] 08/16/2013, 7:37 AM

## 2013-08-16 NOTE — Consult Note (Signed)
CARDIOLOGY CONSULT NOTE   Patient ID: Jesse Cook MRN: [id number] DOB/AGE: 04-Aug-1986 26 y.o.  Admit date: 08/16/2013  Primary Physician   Street, Cristal Deer, MD Primary Cardiologist   New Reason for Consultation   Chest pain and numbness   YQM:VHQIO Lacretia Nicks Cook is a 27 y.o. male with no history of CAD.  He had onset of SSCP and left-sided numbness, paralysis (also a little stiff). The symptoms began about 4pm 11/12. He was lying down working on a car and was able to use right arm/leg to move himself around. In about 30 minutes, he was able to move left side well enough to get up. Developed chest pain at the same time, 10/10, tightness and "stretching" feeling. Worse with deep inspiration, chest wall is tender. Improved slightly after he got up, 8/10. Did not try any medications. Tried to sleep, but wife noticed entire chest move with his heart beat and he felt hot to her.   Came to ER, temp 100.7, HR 134, BP 161/99. He rec'd ASA 324 mg, Lopressor 25 mg, Levaquin 750 mg, subcu heparin and Tylenol. The chest pain improved to a 2/10, still worse with deep inspiration and with tender chest wall. His left side is burning all the way down. Touching makes the burning worse, denies perception of sharp/dull. He has never had these symptoms before.  Has been out of BP meds x 4 days. Ongoing tobacco use, no recent illness or use/abuse of ETOH/drugs. No cough/cold symptoms, no sick contacts. Does not track weight, but not aware of any recent change. Very poor diet. Does not exercise. No new DOE or SOB.    Past Medical History  Diagnosis Date  . Hypertension     Diagnosed 2000  . Hyperlipidemia     Diagnosed 2004  . Lateral femoral cutaneous neuropathy      Past Surgical History  Procedure Laterality Date  . Lymph node biopsy      No Known Allergies  I have reviewed the patient's current medications . atorvastatin  40 mg Oral q1800  . heparin  5,000 Units Subcutaneous Q8H  .  hydrochlorothiazide  25 mg Oral Daily  . lisinopril  20 mg Oral Daily  . metoprolol tartrate  25 mg Oral BID   . sodium chloride 75 mL/hr at 08/16/13 0544   acetaminophen, acetaminophen, morphine injection, nitroGLYCERIN  Prior to Admission medications   Medication Sig Start Date End Date Taking? Authorizing Provider  lisinopril-hydrochlorothiazide (PRINZIDE,ZESTORETIC) 20-25 MG per tablet Take 1 tablet by mouth daily. for blood pressure and leg swelling    Yes Historical Provider, MD  metoprolol tartrate (LOPRESSOR) 25 MG tablet Take 1 tablet (25 mg total) by mouth 2 (two) times daily. 07/22/11  Yes Macy Mis, MD  Omeprazole ([facility] OMEPRAZOLE) 20 MG TBEC Take 1 tablet by mouth every morning.    Yes Historical Provider, MD  simvastatin (ZOCOR) 40 MG tablet Take 1 tablet (40 mg total) by mouth daily. 07/22/11  Yes Macy Mis, MD     History   Social History  . Marital Status: Married    Spouse Name: N/A    Number of Children: N/A  . Years of Education: N/A   Occupational History  . Mechanic    Social History Main Topics  . Smoking status: Former Smoker    Quit date: 07/19/2003  . Smokeless tobacco: Current User     Comment: Dips 2 cans/day.  . Alcohol Use: 2.4 oz/week  4 Cans of beer per week  . Drug Use: No  . Sexual Activity: Yes   Other Topics Concern  . Not on file   Social History Narrative   Pt lives with wife.     Family Status  Relation Status Death Age  . Mother Alive   . Father Alive   . Brother Alive    Family History  Problem Relation Age of Onset  . Diabetes Mother   . COPD Mother   . Heart disease Father 79    1st MI at 69, total 8 stents  . Hypertension Father   . Diabetes Father   . Hyperlipidemia Father   . Heart disease Brother 32    2 blockages, no stents as of 2014  . Kidney disease Brother      ROS:  Full 14 point review of systems complete and found to be negative unless listed above.  Physical Exam: Blood pressure 112/77,  pulse 68, temperature 99.6 F (37.6 C), temperature source Oral, resp. rate 24, height 5\' 6"  (1.676 m), weight 290 lb (131.543 kg), SpO2 96.00%.  General: Well developed, well nourished, male in no acute distress Head: Eyes PERRLA, No xanthomas.   Normocephalic and atraumatic, oropharynx without edema or exudate. Dentition: poor Lungs: CTA bilaterally Heart: HRRR S1 S2, no rub/gallop, no murmur. pulses are 2+ all 4 extrem.   Neck: No carotid bruits. No lymphadenopathy.  JVD not elevated but difficult to assess secondary to body habitus. Abdomen: Bowel sounds present, abdomen soft and non-tender without masses or hernias noted. Msk:  No spine or cva tenderness. No weakness, no joint deformities or effusions. Extremities: No clubbing or cyanosis. no edema.  Neuro: Alert and oriented X 3. Left arm and leg with some weakness compared to right. No facial droop. Pt c/o constant burning, worse with movement or palpation. Cannot differentiate sharp/dull. Feels pressure. Psych:  Good affect, responds appropriately Skin: No rashes or lesions noted.  Labs:   Lab Results  Component Value Date   WBC 7.8 08/16/2013   HGB 15.6 08/16/2013   HCT 42.6 08/16/2013   MCV 85.9 08/16/2013   PLT 183 08/16/2013     Recent Labs Lab 08/16/13 0129  NA 136  K 3.5  CL 100  BUN 10  CREATININE 1.20  GLUCOSE 127*    Recent Labs  08/16/13 0445  TROPONINI <0.30    Recent Labs  08/16/13 0129  TROPIPOC 0.01   Lab Results  Component Value Date   CHOL 261* 07/15/2009   HDL 36* 07/15/2009   LDLCALC 184* 07/15/2009   TRIG 203* 07/15/2009   TSH: PENDING  Drugs of Abuse     Component Value Date/Time   LABOPIA NONE DETECTED 08/16/2013 0750   COCAINSCRNUR NONE DETECTED 08/16/2013 0750   LABBENZ NONE DETECTED 08/16/2013 0750   AMPHETMU NONE DETECTED 08/16/2013 0750   THCU NONE DETECTED 08/16/2013 0750   LABBARB NONE DETECTED 08/16/2013 0750     Lexiscan Myoview 01/03/2012 Findings: Rest images  demonstrate probable mild apical thinning. Normal left ventricular cavity size.    Stress images demonstrate no areas of reversibility to suggest inducible ischemia.    Evaluation of wall motion demonstrates normal left ventricular wall motion and thickening.    Ejection fraction is estimated at 52%. End diastolic volume of 146 cc. End systolic volume of 70 cc. IMPRESSION: 1. No reversible defects to suggest inducible ischemia. 2. Normal left ventricular wall motion and thickening. 3. Normal ejection fraction.  ECG: 08/16/2013 SR, diffuse  T wave flattening inferior/lateral leads, minimal change from 2013 Vent. rate 63 BPM PR interval 170 ms QRS duration 100 ms QT/QTc 411/421 ms P-R-T axes 43 35 23  Radiology:  Dg Chest 2 View 08/16/2013   CLINICAL DATA:  Chest pain.  EXAM: CHEST  2 VIEW  COMPARISON:  12/27/2011.  FINDINGS: Poor inspiration. Grossly normal sized heart. Clear lungs. Diffuse peribronchial thickening. Unremarkable bones.  IMPRESSION: Moderate bronchitic changes with mild progression.   Electronically Signed   By: Gordan Payment M.D.   On: 08/16/2013 01:44   Ct Head Wo Contrast 08/16/2013   CLINICAL DATA:  Chest pain with left arm numbness.  EXAM: CT HEAD WITHOUT CONTRAST  TECHNIQUE: Contiguous axial images were obtained from the base of the skull through the vertex without intravenous contrast.  COMPARISON:  None available for comparison at time of study interpretation.  FINDINGS: The ventricles and sulci are normal. No intraparenchymal hemorrhage, mass effect nor midline shift. No acute large vascular territory infarcts.  No abnormal extra-axial fluid collections. Basal cisterns are patent.  No skull fracture. Visualized paranasal sinuses and mastoid air-cells are well-aerated. The included ocular globes and orbital contents are non-suspicious.  IMPRESSION: No acute intracranial process; normal noncontrast CT of the head.   Electronically Signed   By: Awilda Metro   On:  08/16/2013 04:20    ASSESSMENT AND PLAN:   The patient was seen today by Dr. Excell Seltzer, the patient evaluated and the data reviewed.  Active Problems:   Unilateral weakness - left arm/leg - per primary MD   Chest pain, localized - pleuritic component (tender chest wall, worse with deep inspiration) but no change sitting up and leaning forward. No rub on exam. No further workup indicated. MD advise if OK to try NSAIDS.  Otherwise, per primary MD.   HYPERLIPIDEMIA - see previous profile, advised pt/wife that nutrition consult was available, they should ask if they want one. Not initially enthusiastic about it.    OBESITY, NOS - see above    TOBACCO USER - strongly advised cessation    HYPERTENSION, BENIGN SYSTEMIC - better on Rx  Signed: Theodore Demark, PA-C 08/16/2013 9:43 AM Beeper [phone]  Co-Sign MD  Patient seen, examined. Available data reviewed. Agree with findings, assessment, and plan as outlined by Theodore Demark, PA-C. Exam reveals an obese young man in NAD. Lungs are clear, JVP - can't see well, heart RRR without murmur or gallop. Abdomen obese NT. Extremities - no edema. EKG within normal limits. Labs with negative troponins. His CP is highly atypical (pleuritic and nonexertinoal). While he has a strong family history, his risk is low at age 65. With that said, he is on a bad course here with morbid obesity, tobacco, and poor insight. I tried to stress to him the importance of risk modification while he is young, but he doesn't seem particularly motivated. He had a stress Myoview last year without significant ischemia and I wouldn't recommend further testing at this point in the setting of highly atypical symptoms. Recommend weight loss,diet, tobacco cesssation, and treatment of HTN/dyslipidemia. thx  Tonny Bollman, M.D. 08/16/2013 3:16 PM

## 2013-08-16 NOTE — ED Notes (Signed)
Report called to stephanie 

## 2013-08-16 NOTE — H&P (Signed)
Family Medicine Teaching [facility] Admission History and Physical Service Pager: [phone]  Patient name: Jesse Cook Medical record number: [id number] Date of birth: November 07, 1985 Age: 27 y.o. Gender: male  Primary Care Provider: Maryjean Ka, MD Consultants: cardiology Code Status: Full  Chief Complaint: chest pain and left sided weakness  Assessment and Plan: Jesse Cook is a 27 y.o. male presenting with chest pain and left sided weakness. PMH is significant for hypertension, hyperlipidemia. He also has a strong family history of coronary artery disease.   # Chest pain: Patient with significant risk factors including hypertension and hyperlipidemia as well as daily history of heart disease at an early age. His father had 8 stents his brother is 47 year old and has significant coronary artery blockage his uncle at 4 years old had heart disease. Other etiologies of his chest pain could include PE. His Wells score appears well at this time however. Aortic dissection as part of the differential in the setting of perfuse diaphoresis, tachycardia, acute chest pain. His pulses are symmetric bilaterally which is reassuring. Will check blood pressures in all extremities and will hold on CT and jiao chest for now. - Cycle troponins -Repeat EKG -Lipid panel, A1c - Start high-dose statin Lipitor 40 - Check UDS -Will consult cards in setting of high-risk patient to see if he needs inpatient versus outpatient stress test vs cath. Stress test done in 2013 was normal.  # Bronchitis: evidenced by chest xray. associated with fever in the ED.  - received levaquin in ED - continue antibiotics for now.   # Left sided weakness: Etiology unclear.  Cannot completely exclude ischemic stroke. CT head normal. -Obtain brain MRI to rule out ischemic stroke -Continues to have weakness and MRI workup normal, consider neurology consult for further recommendations   # Hypertension: In 120s to 130s range  in the ED. - Continue lisinopril and metoprolol  # Hyperlipidemia: Start high-intensity statin  FEN/GI: n.p.o. normal saline at 75 mils per hour  Prophylaxis: heparin   Disposition: admit to step down. Patient and his wife were updated at the bedside   History of Present Illness: Jesse Cook is a 27 y.o. male presenting with chest pain and left-sided weakness that started at 4 PM on the day of admission. He describes chest pain as tight and sharp. It started all of a sudden. Pain was worse with walking and exertion. He had associated shortness of breath and profuse diaphoresis. No nausea. He felt lightheaded at the time. Associated with the chest pain, he had a burning sensation in his left arm and leg. This was associated with feeling of being paralyzed on that side of his body. He could not get up from the sitting position for 40 minutes. Pain in his leg and arm is described as burning pain worse with touch. He also describes numbness and weakness. Chest pain, left-sided pain, left-sided weakness persisted into him coming to the emergency room and were still present at the time that patient was seen. He reports that he ran out of his blood pressure medicines 4 days ago.   In the ED he had a chest x-ray which showed findings of bronchitis. He is found to be febrile and 100.7. He was started on Levaquin for treatment of bacterial bronchitis. EKG initially showed tachycardia and possible left anterior fascicular block. He had a head CT which was negative.  Review Of Systems: Per HPI with the following additions: none Otherwise 12 point review of systems was performed and  was unremarkable.  Patient Active Problem List   Diagnosis Date Noted  . Physical exam, annual 07/29/2011  . Paresthesia and pain of extremity 07/22/2011  . Low back pain 07/22/2011  . TOBACCO USER 07/12/2009  . HYPERLIPIDEMIA 01/15/2008  . SKIN TAG 03/09/2007  . OBESITY, NOS 12/01/2006  . HYPERTENSION, BENIGN SYSTEMIC  12/01/2006   Past Medical History: Past Medical History  Diagnosis Date  . Hypertension   . Hyperlipidemia   . Lateral femoral cutaneous neuropathy    Past Surgical History: Past Surgical History  Procedure Laterality Date  . Lymph node biopsy     Social History: History  Substance Use Topics  . Smoking status: Former Games developer  . Smokeless tobacco: Current User    Last Attempt to Quit: 07/28/2005  . Alcohol Use: 2.4 oz/week    4 Cans of beer per week   Additional social history:  denies any illicit drug use.  Please also refer to relevant sections of EMR.  Family History: Family History  Problem Relation Age of Onset  . Diabetes Mother   . COPD Mother   . Heart disease Father   . Hypertension Father   . Diabetes Father   . Hyperlipidemia Father   . Heart disease Brother   . Kidney disease Brother    Allergies and Medications: No Known Allergies No current facility-administered medications on file prior to encounter.   Current Outpatient Prescriptions on File Prior to Encounter  Medication Sig Dispense Refill  . lisinopril-hydrochlorothiazide (PRINZIDE,ZESTORETIC) 20-25 MG per tablet Take 1 tablet by mouth daily. for blood pressure and leg swelling       . metoprolol tartrate (LOPRESSOR) 25 MG tablet Take 1 tablet (25 mg total) by mouth 2 (two) times daily.  60 tablet  0  . Omeprazole ([employer]S OMEPRAZOLE) 20 MG TBEC Take 1 tablet by mouth every morning.       . simvastatin (ZOCOR) 40 MG tablet Take 1 tablet (40 mg total) by mouth daily.  30 tablet  5    Objective: BP 134/79  Pulse 104  Temp(Src) 99.6 F (37.6 C) (Oral)  Resp 22  Ht 5\' 6"  (1.676 m)  Wt 290 lb (131.543 kg)  BMI 46.83 kg/m2  SpO2 98% Exam: General: obese male, diaphoretic, red in the face, in some discomfort, alert and oriented x4 HEENT: pupils equal round and reactive to light and accommodation, oropharynx clear, no thyroid enlargement noted.  Cardiovascular: S1-S2, tachycardic, regular rhythm,  no murmur appreciated  Respiratory: clear to auscultation bilaterally on anterior exam Abdomen: no tenderness to palpation, no distention,  Extremities: +2 dorsalis pedis pulse bilaterally, trace lower extremity edema Skin: no rashes, normal Neuro: cranial nerves II through XII grossly intact, finger to nose normal, poor effort with left arm movement and strength against gravity. 4/5 strength in left lower leg compared to 5/5 on right, increased sensitivity to touch on right leg compared to left  Labs and Imaging: CBC BMET   Recent Labs Lab 08/16/13 0236  WBC 7.8  HGB 15.6  HCT 42.6  PLT 183    Recent Labs Lab 08/16/13 0129  NA 136  K 3.5  CL 100  BUN 10  CREATININE 1.20  GLUCOSE 127*    EXAM:  CT HEAD WITHOUT CONTRAST  TECHNIQUE:  Contiguous axial images were obtained from the base of the skull  through the vertex without intravenous contrast.  COMPARISON: None available for comparison at time of study  interpretation.  FINDINGS:  The ventricles and sulci are normal.  No intraparenchymal hemorrhage,  mass effect nor midline shift. No acute large vascular territory  infarcts.  No abnormal extra-axial fluid collections. Basal cisterns are  patent.  No skull fracture. Visualized paranasal sinuses and mastoid  air-cells are well-aerated. The included ocular globes and orbital  contents are non-suspicious.  IMPRESSION:  No acute intracranial process; normal noncontrast CT of the head.   Lonia Skinner, MD 08/16/2013, 4:50 AM PGY-3, [place] [facility] FPTS Intern pager: [phone], text pages welcome

## 2013-08-16 NOTE — ED Notes (Signed)
Report given to Waneta Martins RN in [place] [facility] for patient holding

## 2013-08-17 ENCOUNTER — Inpatient Hospital Stay (HOSPITAL_COMMUNITY): Payer: MEDICAID

## 2013-08-17 ENCOUNTER — Telehealth: Payer: Self-pay | Admitting: *Deleted

## 2013-08-17 DIAGNOSIS — I517 Cardiomegaly: Secondary | ICD-10-CM

## 2013-08-17 LAB — TSH: TSH: 1.224 u[IU]/mL (ref 0.350–4.500)

## 2013-08-17 MED ORDER — LISINOPRIL 20 MG PO TABS: 20.0000 mg | ORAL_TABLET | Freq: Every day | ORAL | Status: DC

## 2013-08-17 MED ORDER — METOPROLOL TARTRATE 25 MG PO TABS: 12.5000 mg | ORAL_TABLET | Freq: Two times a day (BID) | ORAL | Status: DC

## 2013-08-17 MED ORDER — METOPROLOL TARTRATE 12.5 MG HALF TABLET
12.5000 mg | ORAL_TABLET | Freq: Two times a day (BID) | ORAL | Status: DC
  Filled 2013-08-17: qty 1

## 2013-08-17 MED ORDER — ATORVASTATIN CALCIUM 80 MG PO TABS: 80.0000 mg | ORAL_TABLET | Freq: Every day | ORAL | Status: DC

## 2013-08-17 MED ORDER — ASPIRIN EC 81 MG PO TBEC: 81.0000 mg | DELAYED_RELEASE_TABLET | Freq: Every day | ORAL | Status: AC

## 2013-08-17 NOTE — Progress Notes (Signed)
08/17/13 Nursing note Blood pressures in all 4 ext. Was completed, and has been documented in doc flow sheets will continue to monitor patient. Toshiro Hanken, [employer]

## 2013-08-17 NOTE — Progress Notes (Signed)
08/17/13 Nursing note  Patient given discharge instructions avs and medicationlist.  Patients prescriptions sent to patients pharmacy so no paper prescriptions given. Will discharge home as ordered. Jesse Cook, Randall An  rN

## 2013-08-17 NOTE — Progress Notes (Signed)
Patient was not able to tolerate the MRI exam due to the scanner being too tight on his shoulders to the point he felt he could not breathe. Exam was not able to be done.

## 2013-08-17 NOTE — Progress Notes (Signed)
Attending Addendum  I examined the patient and discussed the assessment and plan with Dr. Caleb Popp. I have reviewed the note and agree.  Patient medically stable for d/c today. No more CP or L sided weakness. Urine and serum drug levels to evaluate for pheo have been obtained.   -Stressed to the patient the importance of f/u. -Outpatient MRI schedule -Patient will take daily ASA 81 mg for CAD prevention. -Patient written to return to work on Tuesday August 21, 2013. He admits that work is a source of significant stress.     Dessa Phi, MD FAMILY MEDICINE TEACHING SERVICE

## 2013-08-17 NOTE — Discharge Summary (Signed)
Family Medicine Teaching [facility] Discharge Summary  Patient name: Jesse Cook Medical record number: [id number] Date of birth: 1986/01/08 Age: 27 y.o. Gender: male Date of Admission: 08/16/2013  Date of Discharge: 08/17/2013 Admitting Physician: Uvaldo Rising, MD  Primary Care Provider: Levert Feinstein, MD Consultants: Cardiology  Indication for Hospitalization: Chest pain and left sided weakness  Discharge Diagnoses/Problem List:  1. Chest pain 2. Left sided weakness 3. Hypertension 4. Hyperlipidemia 5. Bronchitis  Disposition: Discharge home  Discharge Condition: Stable  Discharge Exam:  General: Laying in bed in no acute distress  Cardiovascular: RRR, no murmurs  Respiratory: clear to auscultation bilaterally  Abdomen: Soft, non-tender, obese  Extremities: No edema, 2+ pulses  Neuro: Cranial nerves 2-12 intact, 5/5 strength upper and lower extremities bilaterally, decreased sensation on left upper and lower extremities  Brief Hospital Course:   1. Chest pain: In the ED, EKG showed tachycardia and possible left anterior fascicular block. No complaints of chest pain on hospital day #2. Cardiology saw patient and assessed pain as having a pleuritic component with signs of tender chest wall and symptoms of worsening pain with deep inspiration. No further workup was required. 2. Left sided weakness: In the ED, head CT did not show any acute intracranial process. On physical exam, left side was slightly weaker with 4/5 strength compared to 5/5 on right. On hospital day #2, patient had 5/5 strength in bilateral upper and lower extremity. Patient did still have some slight decrease sensation and a cold feeling on left sided upper and lower extremities. Head MRI was attempted but patient did not tolerate exam due to physical size. Will obtain MRI outpatient. 3. Hypertension: BPs in the ED ranged from 120s to 130s. Patient continued on Lisinopril, HCTZ and Metoprolol and BPs  remained stable from 110s to 140s. Patient discharged on home lisinopril-hctz and metoprolol. 4. Hyperlipidemia: Patient started on atorvastatin 80mg  since has a high risk for CAD and continued on discharge. 5. Bronchitis: X-ray was suggestive of bronchitis picture. Patient received one dose of Levaquin in the ED.  Issues for Follow Up:  1. Follow-up outpatient MRI brain 2. Follow-up urine metanepherine/catecholamines and plasma metanepherines to work up pheochromocytoma. (Urine: Norepinephrine >170 mcg/24 hour; Epinephrine >35 mcg/24 hour; Dopamine >700 mcg/24 hour; Normetanephrine >900 mcg/24 hour or metanephrine >400 mcg/24 hour) 3. Follow-up left sided weakness 4. Confusion regarding prescription of lisinopril-hctz. Patient discharged with instructions to take lisinopril 20mg  and lisinopril-hctz 20-25mg . Patient called and told to only take lisinopril-hctz. Patient voiced understanding.   Significant Procedures: 1. 2D echocardiogram  Significant Labs and Imaging:   Recent Labs Lab 08/16/13 0129 08/16/13 0236 08/16/13 1635  WBC  --  7.8 7.6  HGB 15.6 15.6 15.7  HCT 46.0 42.6 44.1  PLT  --  183 164    Recent Labs Lab 08/16/13 0129 08/16/13 1040  NA 136 136  K 3.5 3.8  CL 100 101  CO2  --  25  GLUCOSE 127* 110*  BUN 10 9  CREATININE 1.20 0.95  CALCIUM  --  8.9   Cardiac Panel (last 3 results)  Recent Labs  08/16/13 0445 08/16/13 1133 08/16/13 1635  TROPONINI <0.30 <0.30 <0.30   TSH  Date Value Range Status  08/16/2013 1.224  0.350 - 4.500 uIU/mL Final     Performed at [facility]   Lipid Panel     Component Value Date/Time   CHOL 198 08/16/2013 1133   TRIG 81 08/16/2013 1133   HDL 34* 08/16/2013 1133  CHOLHDL 5.8 08/16/2013 1133   VLDL 16 08/16/2013 1133   LDLCALC 148* 08/16/2013 1133    Results/Tests Pending at Time of Discharge: 1. Urine catecholamines 2. Urine metanephrines 3. Plasma metanephrines   Discharge Medications:     Medication List    STOP taking these medications       simvastatin 40 MG tablet  Commonly known as:  ZOCOR      TAKE these medications       aspirin EC 81 MG tablet  Take 1 tablet (81 mg total) by mouth daily.     atorvastatin 80 MG tablet  Commonly known as:  LIPITOR  Take 1 tablet (80 mg total) by mouth daily at 6 PM.     CVS OMEPRAZOLE 20 MG Tbec  Generic drug:  Omeprazole  Take 1 tablet by mouth every morning.     lisinopril-hydrochlorothiazide 20-25 MG per tablet  Commonly known as:  PRINZIDE,ZESTORETIC  Take 1 tablet by mouth daily. for blood pressure and leg swelling     metoprolol tartrate 25 MG tablet  Commonly known as:  LOPRESSOR  Take 0.5 tablets (12.5 mg total) by mouth 2 (two) times daily.        Discharge Instructions: Please refer to Patient Instructions section of EMR for full details.  Patient was counseled important signs and symptoms that should prompt return to medical care, changes in medications, dietary instructions, activity restrictions, and follow up appointments.   Follow-Up Appointments:     Follow-up Information   Follow up with Jacquelin Hawking, MD On 08/23/2013. (2:30PM for hospital follow-up)    Specialty:  Family Medicine   Contact information:   [address] [place] [address] [phone]       Follow up with [facility] On 08/26/2013. (at 7:30am for your brain MRI (Sunday 11/23 was first available appointment))    Contact information:   [facility]  [address]  Ph # [phone]        Jacquelin Hawking, MD 08/17/2013, 10:31 PM PGY-1, [facility]

## 2013-08-17 NOTE — Care Management Note (Signed)
    Page 1 of 1   08/17/2013     5:14:55 PM   CARE MANAGEMENT NOTE 08/17/2013  Patient:  Jesse Cook, Jesse Cook   Account Number:  [id number]  Date Initiated:  08/17/2013  Documentation initiated by:  Remmy Crass  Subjective/Objective Assessment:   PT ADM WITH CP, LT WEAKNESS.   PTA, PT INDEPENDENT, LIVES WITH WIFE     Action/Plan:   FOLLOW FOR DC NEEDS AS PT PROGRESSES.  MAY NEED HELP WITH MEDS.   Anticipated DC Date:  08/17/2013   Anticipated DC Plan:  HOME/SELF CARE      DC Planning Services  CM consult      Choice offered to / List presented to:             Status of service:  Completed, signed off Medicare Important Message given?   (If response is "NO", the following Medicare IM given date fields will be blank) Date Medicare IM given:   Date Additional Medicare IM given:    Discharge Disposition:  HOME/SELF CARE  Per UR Regulation:  Reviewed for med. necessity/level of care/duration of stay  If discussed at Long Length of Stay Meetings, dates discussed:    Comments:  08/17/13 Ayeshia Coppin,RN,BSN [phone] ALL BUT ONE OF PT'S DC MEDS ARE $4 GENERICS AT [facility]; PT LEFT PRIOR TO CM CONSULT, BUT DENIED TROUBLE GETTING MEDS FILLED TO NURSE.

## 2013-08-17 NOTE — Telephone Encounter (Signed)
Scheduled open MRI at [facility] [address] Ph # [phone]  Sunday 08/26/2013 AT 7:30 AM. THIS IS THE FIRST AVAILABLE APPT FOR THE OPEN MRI. Will fwd. To Dr.McIntyre for info. Lorenda Hatchet, Renato Battles

## 2013-08-17 NOTE — Telephone Encounter (Signed)
Open MRI is only available at [facility]. [facility] and [facility] do not have open MRI. Do you want me to schedule it at [facility]? ( paged you x 3 :) .Arlyss Repress

## 2013-08-17 NOTE — Progress Notes (Signed)
Family Medicine Teaching Service Daily Progress Note Intern Pager: [phone]  Patient name: Jesse Cook Medical record number: [id number] Date of birth: 1986/01/20 Age: 27 y.o. Gender: male  Primary Care Provider: Levert Feinstein, MD Consultants: Cardiology Code Status: Full code  Pt Overview and Major Events to Date:   Assessment and Plan: Jesse Cook is a 27 y.o. male presenting with chest pain and left sided weakness. PMH is significant for hypertension, hyperlipidemia. He also has a strong family history of coronary artery disease.   # Chest pain: Troponin negative x3. UDS negative. No current complaints of chest pain - Cycle troponins  - Repeat EKG  - Lipid panel, A1c  - continue lipitor 80 - cardiology recommendations: weight loss, diet, tobacco cessation and treatment of HTN and hyperlipidemia-   # Bronchitis: evidenced by chest xray. associated with fever in the ED. No fever since admission. Received Levaquin in the ED  # Left sided weakness: Etiology unclear. Cannot completely exclude ischemic stroke. CT head normal. Currently resolved but has decreased sensation - follow-up brain MRI to rule out ischemic stroke  - will consider neurology consult for further recommendations if he continues to have weakness and MRI is normal  # Hypertension: sBPs range from 90-140s - follow-up four extremity BPs - Continue lisinopril and hctz - decrease metoprolol to 12.5 BID - follow-up urine metanepherine/catecholamines and plasma metanepherines to work up pheochromocytoma. (Urine: Norepinephrine >170 mcg/24 hour; Epinephrine >35 mcg/24 hour; Dopamine >700 mcg/24 hour; Normetanephrine >900 mcg/24 hour or metanephrine >400 mcg/24 hour)  # Hyperlipidemia - continue atorvastatin   FEN/GI: Heart healthy, saline lock  Prophylaxis: heparin   Disposition: Pending results of MRI, otherwise, discharge home today    Subjective: No problems overnight. No chest pain or shortness of breath.  His weakness has resolved. He does notice that his left side feels different from his right, mainly regarding sensing temperature.  Objective: Temp:  [98 F (36.7 C)-98.2 F (36.8 C)] 98.1 F (36.7 C) (11/14 0358) Pulse Rate:  [59-71] 59 (11/14 0358) Resp:  [17-48] 20 (11/14 0358) BP: (94-148)/(22-88) 94/48 mmHg (11/14 0358) SpO2:  [94 %-100 %] 98 % (11/14 0358) Weight:  [293 lb 10.4 oz (133.2 kg)] 293 lb 10.4 oz (133.2 kg) (11/13 1000)  Physical Exam: General: Laying in bed in no acute distress Cardiovascular: RRR, no murmurs Respiratory: clear to auscultation bilaterally Abdomen: Soft, non-tender, obese Extremities: No edema, 2+ pulses Neuro: Cranial nerves 2-12 intact, 5/5 strength upper and lower extremities bilaterally, decreased sensation on left upper and lower extremities  Laboratory:  Recent Labs Lab 08/16/13 0129 08/16/13 0236 08/16/13 1635  WBC  --  7.8 7.6  HGB 15.6 15.6 15.7  HCT 46.0 42.6 44.1  PLT  --  183 164    Recent Labs Lab 08/16/13 0129 08/16/13 1040  NA 136 136  K 3.5 3.8  CL 100 101  CO2  --  25  BUN 10 9  CREATININE 1.20 0.95  CALCIUM  --  8.9  GLUCOSE 127* 110*   Cardiac Panel (last 3 results)  Recent Labs  08/16/13 0445 08/16/13 1133 08/16/13 1635  TROPONINI <0.30 <0.30 <0.30   Drugs of Abuse     Component Value Date/Time   LABOPIA NONE DETECTED 08/16/2013 0750   COCAINSCRNUR NONE DETECTED 08/16/2013 0750   LABBENZ NONE DETECTED 08/16/2013 0750   AMPHETMU NONE DETECTED 08/16/2013 0750   THCU NONE DETECTED 08/16/2013 0750   LABBARB NONE DETECTED 08/16/2013 0750     Imaging/Diagnostic Tests:  Head CT (11/14) IMPRESSION:  No acute intracranial process; normal noncontrast CT of the head.  Jacquelin Hawking, MD 08/17/2013, 5:33 AM PGY-1, [place] [facility] FPTS Intern pager: [phone], text pages welcome

## 2013-08-17 NOTE — Telephone Encounter (Signed)
Thanks Thekla! [place]

## 2013-08-17 NOTE — Progress Notes (Signed)
  Echocardiogram 2D Echocardiogram has been performed.  Arvil Chaco 08/17/2013, 1:10 PM

## 2013-08-19 ENCOUNTER — Telehealth: Payer: Self-pay | Admitting: Family Medicine

## 2013-08-19 NOTE — Discharge Summary (Signed)
Attending Addendum  I examined the patient and discussed the discharge plan with Dr. Nettey. I have reviewed the note and agree.    Yanette Tripoli, MD FAMILY MEDICINE TEACHING SERVICE   

## 2013-08-19 NOTE — Telephone Encounter (Signed)
Patient called because he was prescribed a new prescription of lisinopril 20mg  in addition to his already prescribed lisinopril-hctz 20-25mg . Patient was advised to stop taking lisinopril and to only take lisinopril-hctz. Patient voiced understanding and will follow-up at outpatient appointment.

## 2013-08-20 LAB — METANEPHRINES, PLASMA: Metanephrine, Free: <25 pg/mL (ref <=57)

## 2013-08-23 ENCOUNTER — Ambulatory Visit (INDEPENDENT_AMBULATORY_CARE_PROVIDER_SITE_OTHER): Payer: Self-pay | Admitting: Family Medicine

## 2013-08-23 ENCOUNTER — Encounter: Payer: Self-pay | Admitting: Family Medicine

## 2013-08-23 VITALS — BP 137/83 | HR 72 | Temp 99.7°F | Ht 66.0 in | Wt 296.0 lb

## 2013-08-23 DIAGNOSIS — R5381 Other malaise: Secondary | ICD-10-CM

## 2013-08-23 DIAGNOSIS — I10 Essential (primary) hypertension: Secondary | ICD-10-CM

## 2013-08-23 DIAGNOSIS — R531 Weakness: Secondary | ICD-10-CM

## 2013-08-23 DIAGNOSIS — R0681 Apnea, not elsewhere classified: Secondary | ICD-10-CM

## 2013-08-23 LAB — CATECHOLAMINES, FRACTIONATED, URINE, 24 HOUR
Norepinephrine 24 Hr Urine: 50 mcg/24 h (ref 15–100)
Total urine volume: 2450 mL

## 2013-08-23 MED ORDER — OMEPRAZOLE 20 MG PO TBEC: 1.0000 | DELAYED_RELEASE_TABLET | ORAL | Status: DC

## 2013-08-23 MED ORDER — LISINOPRIL-HYDROCHLOROTHIAZIDE 20-25 MG PO TABS: 1.0000 | ORAL_TABLET | Freq: Every day | ORAL | Status: DC

## 2013-08-23 NOTE — Progress Notes (Signed)
  Subjective:    Patient ID: Jesse Cook, male    DOB: July 22, 1986, 27 y.o.   MRN: [id number]  HPI  Hospital follow-up: Left sided weakness Patient reports for hospital follow-up where he was worked up for new onset left sided weakness. During hospital course, his weakness improved. He was not able to get an MRI inpatient and has an appointment for outpatient imaging which he will receive on 08/26/2013. Since discharge, he still has weakness that gets worse the longer he uses his muscles. He describes getting weak when standing/walking for long periods and his arm getting weak when holding objects for prolonged periods. There is no facial involvement.  Hypertension Patient reports compliance with lisinopril-hctz 20-25mg  once a day and metoprolol 12.5mg  twice a day. Does not repot any side effects.  Apneic spells Patient also mentions many year history of night time awakenings, snoring, apneic spells, and daytime somnolence. He has not been worked up for OSA because he has not had insurance.   Review of Systems  Musculoskeletal: Negative for arthralgias and myalgias.  Neurological: Positive for weakness and numbness. Negative for facial asymmetry.       Complaints are left-sided       Objective:   Physical Exam  Constitutional: He is oriented to person, place, and time. He appears well-developed and well-nourished.  Cardiovascular: Normal rate, regular rhythm and normal heart sounds.   Musculoskeletal:  Left upper extremity strength between 4-5/5 and left lower extremity strength is 4/5.  Neurological: He is alert and oriented to person, place, and time. He has normal reflexes.          Assessment & Plan:

## 2013-08-23 NOTE — Patient Instructions (Signed)
Hi Jesse Cook, today we spoke mainly about your left sided weakness and hypertension. It seems like your hypertension is controlled, which is great. Please continue taking your lisinopril-hydrochlorothiazide and metoprolol as directed. Regarding your weakness, we will wait for the results of your MRI to see if it can help [place] discover the reason for your symptoms. Please see your primary care physician, Dr. Casper Harrison, on Wednesday, November 26th. He will also be the physician to fill out your FMLA paperwork. If you have any questions please do not hesitate to call.   Sincerely,  Jesse Hawking, MD

## 2013-08-24 DIAGNOSIS — R0681 Apnea, not elsewhere classified: Secondary | ICD-10-CM | POA: Insufficient documentation

## 2013-08-24 LAB — METANEPHRINES, URINE, 24 HOUR
Metanephrines, Ur: 69 mcg/24 h (ref 25–222)
Normetanephrine, 24H Ur: 415 mcg/24 h — ABNORMAL HIGH (ref 40–412)

## 2013-08-24 NOTE — Assessment & Plan Note (Signed)
Patient was accidentally discharged on lisinopril and lisinopril-hctz. He has only been taking lisinopril-hctz since discharge, though. Blood pressure controlled today. Will continue current regimen of lisinopril-hctz and metoprolol and have patient follow-up with PCP

## 2013-08-24 NOTE — Assessment & Plan Note (Signed)
Patient still has weakness on left upper/lower extremity. Will have PCP follow-up results of MRI. Patient has a written excuse from work until 11/25. Patient also has FMLA forms that will be left for PCP for follow-up.

## 2013-08-26 ENCOUNTER — Ambulatory Visit
Admission: RE | Admit: 2013-08-26 | Discharge: 2013-08-26 | Disposition: A | Discharge status: Home / Self Care | Payer: No Typology Code available for payment source | Source: Ambulatory Visit | Attending: Family Medicine | Admitting: Family Medicine

## 2013-08-26 ENCOUNTER — Other Ambulatory Visit (HOSPITAL_COMMUNITY): Payer: Self-pay | Admitting: Family Medicine

## 2013-08-26 ENCOUNTER — Ambulatory Visit
Admit: 2013-08-26 | Discharge: 2013-08-26 | Disposition: A | Discharge status: Home / Self Care | Payer: No Typology Code available for payment source | Attending: Family Medicine | Admitting: Family Medicine

## 2013-08-26 DIAGNOSIS — T7589XA Other specified effects of external causes, initial encounter: Secondary | ICD-10-CM

## 2013-08-26 DIAGNOSIS — R531 Weakness: Secondary | ICD-10-CM

## 2013-08-29 ENCOUNTER — Ambulatory Visit (INDEPENDENT_AMBULATORY_CARE_PROVIDER_SITE_OTHER): Payer: Self-pay | Admitting: Family Medicine

## 2013-08-29 ENCOUNTER — Encounter: Payer: Self-pay | Admitting: Family Medicine

## 2013-08-29 VITALS — BP 157/107 | HR 73 | Temp 98.5°F | Ht 66.0 in | Wt 298.7 lb

## 2013-08-29 DIAGNOSIS — R531 Weakness: Secondary | ICD-10-CM

## 2013-08-29 DIAGNOSIS — M79609 Pain in unspecified limb: Secondary | ICD-10-CM

## 2013-08-29 DIAGNOSIS — R5381 Other malaise: Secondary | ICD-10-CM

## 2013-08-29 DIAGNOSIS — R209 Unspecified disturbances of skin sensation: Secondary | ICD-10-CM

## 2013-08-29 DIAGNOSIS — I10 Essential (primary) hypertension: Secondary | ICD-10-CM

## 2013-08-29 NOTE — Patient Instructions (Signed)
Thank you for coming in, today!  I'm not sure what is causing your weakness and other symptoms. It could be related to your nerves or muscles, since your MRI was normal. I want you to see the neurologist, to see if they can help with some other nerve/muscle testing.  Make sure you talk to the people here about getting the orange card, again. Work on [employer], as well -- the orange card is NOT insurance.   Come back to see me after you are seen by the neurologist. I would also like you to be seen by your cardiologist, again, as well. We may need to adjust / increase your blood pressure medicines. Please feel free to call with any questions or concerns at any time, at [phone]. --Dr. Casper Harrison

## 2013-08-29 NOTE — Assessment & Plan Note (Signed)
Uncertain etiology. Recent MRI brain was normal. No definite deformity of spine, and symptoms are unilateral making myasthenia gravis less likely. Discussed with Dr. Lum Babe; referral placed to neurologist for consideration of further MRI studies (?neck, ?spine) and/or nerve conduction studies, etc. 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 Follow up with me once he can see neurologist.  Of note, pt does not currently have insurance and is working on getting Medicaid. Also suggested getting orange card, which pt will do.

## 2013-08-29 NOTE — Progress Notes (Signed)
   Subjective:    Patient ID: Jesse Cook, male    DOB: July 22, 1986, 27 y.o.   MRN: [id number]  HPI: Pt presents to clinic to f/u on MRI scan for left arm and leg weakness, as well as HTN.  Weakness - left arm/leg, recently discharged from the hospital -MRI brain a few days ago was normal -since coming home from hospital, his left arm and leg weakness has been better, but is still present -gets worse with activity, standing or walking for long periods, lifting or holding things in his left arm, or reaching up over his head -numbness but does have some burning pain; pain is constant and also gets worse when the weakness gets worse  Of note, pt does complain of occasional neck or back pain worse with lying down flat prone or supine.  Pt states his back "locks up" occasionally, with increased pain.  He does not know if this pain is associated with his weakness, but the burning pain started about the same time as his back pain, about 1 year ago.  HTN - reports compliance with medications -no headaches, chest pain, LE swelling -occasionally wakes up with headaches -states "the bottom number" is "always around 100-113 or so" -has a cardiologist through [place], but has not seen them in about 1 year   Review of Systems: As above. Otherwise, feels okay without specific complaint.     Objective:   Physical Exam BP 157/107  Pulse 73  Temp(Src) 98.5 F (36.9 C) (Oral)  Ht 5\' 6"  (1.676 m)  Wt 298 lb 11.2 oz (135.489 kg)  BMI 48.23 kg/m2  Gen: well-appearing adult male, in NAD HEENT: PERRLA, EOMI, [place]/AT, MMM, sclerae/conjunctivae clear, TM's clear bilaterally MSK: ROM normal to all four extremities, neck, and back (lateral bending, twisting, extension, flexion all intact)  Some increased mid-back pain with twisting  No definite point tenderness of mid-back; some low-lumbar tenderness and some cervical tenderness  Not distinct whether tenderness is over bony prominences or muscular, secondary  to body habitus Neuro: grossly intact cranial nerves, balance / gait intact  Left upper and lower extremities with 4+/5 strength in flexion, extension, and grip  Right upper and lower extremities with 5/5 strength  Sensation grossly intact to light touch Cardio: RRR, no murmur Pulm: CTAB, though breath sounds distant secondary to body habitus Abd: morbidly obese, soft, nontender, BS+     Assessment & Plan:

## 2013-08-29 NOTE — Assessment & Plan Note (Signed)
Likely related to weakness, but of uncertain cause. Per chart review, pt has apparently had meralgia parasthetica-like symptoms in the past, but this was likely due to his weight, and does not explain his upper extremity symptoms. Neuro referral pending. See other problem list note.

## 2013-08-29 NOTE — Assessment & Plan Note (Signed)
BP elevated intermittently, perhaps related to pain/discomfort anxiety concerning left arm and leg weakness/pain. Compliant with lisinopril-HCTZ and metoprolol. Continue these for now, consider up-titration and/or addition of another agent (?amlodipine), depending on readings at follow-up in 2-3 months, after pt sees neurologist. Also requested that pt f/u with cardiologist.

## 2013-09-03 ENCOUNTER — Telehealth: Payer: Self-pay | Admitting: Family Medicine

## 2013-09-03 NOTE — Telephone Encounter (Signed)
Pt is aware about appt @ [facility]  On 12/15@1 :00pm   [place]

## 2013-09-03 NOTE — Telephone Encounter (Signed)
Pt called and would like to know the status of his referral. He can not go back to work until he has that appointment and also has to let his employer know. jw

## 2013-09-17 ENCOUNTER — Telehealth: Payer: Self-pay | Admitting: Family Medicine

## 2013-09-17 NOTE — Telephone Encounter (Signed)
I'm not sure what she means. Patient had an appointment for neurology at [facility] today for neurology, so I assume it's that they won't see him due to having no insurance. He can come in for an appointment with me (or someone else) to discuss different options. Thanks. --CMS

## 2013-09-17 NOTE — Telephone Encounter (Signed)
Called pt and informed. Agreed. Lorenda Hatchet, Renato Battles

## 2013-09-17 NOTE — Telephone Encounter (Signed)
Wife called because the appointment they had to get a X-rays on her husbands brain. The problem was they will not see him because he has no insurance. She doesn't know what they need to do now.  jw

## 2013-09-17 NOTE — Telephone Encounter (Signed)
Will fwd. To PCP for review .Corderro Koloski  

## 2013-09-19 ENCOUNTER — Ambulatory Visit: Payer: Self-pay | Admitting: Family Medicine

## 2013-09-19 ENCOUNTER — Encounter: Payer: Self-pay | Admitting: Family Medicine

## 2013-09-19 ENCOUNTER — Ambulatory Visit (INDEPENDENT_AMBULATORY_CARE_PROVIDER_SITE_OTHER): Payer: Self-pay | Admitting: Family Medicine

## 2013-09-19 VITALS — BP 132/84 | HR 73 | Temp 98.4°F | Ht 66.0 in | Wt 302.0 lb

## 2013-09-19 DIAGNOSIS — M79609 Pain in unspecified limb: Secondary | ICD-10-CM

## 2013-09-19 DIAGNOSIS — R531 Weakness: Secondary | ICD-10-CM

## 2013-09-19 DIAGNOSIS — R209 Unspecified disturbances of skin sensation: Secondary | ICD-10-CM

## 2013-09-19 DIAGNOSIS — I10 Essential (primary) hypertension: Secondary | ICD-10-CM

## 2013-09-19 DIAGNOSIS — R5381 Other malaise: Secondary | ICD-10-CM

## 2013-09-19 MED ORDER — GABAPENTIN 100 MG PO CAPS: 100.0000 mg | ORAL_CAPSULE | Freq: Three times a day (TID) | ORAL | Status: DC

## 2013-09-19 MED ORDER — METOPROLOL TARTRATE 25 MG PO TABS: 25.0000 mg | ORAL_TABLET | Freq: Two times a day (BID) | ORAL | Status: DC

## 2013-09-19 NOTE — Patient Instructions (Signed)
Your blood pressure is still too high Please increase your metoprolol to 25 mg Please start taking the gabapentin (nerve pill) for your left arm Please come back in 2-4 weeks to see Dr. Casper Harrison again to check on your blood pressure and arm pain.  Bring in your blood pressure cuff at your next visit.

## 2013-09-19 NOTE — Assessment & Plan Note (Signed)
Resolved

## 2013-09-19 NOTE — Progress Notes (Signed)
Jesse Cook is a 27 y.o. male who presents to [facility] today for L arm pain.  L arm weakness: strength has returned. Still w/ some residual numbness. Unable to see neurologist due to no insurance. Already back to work of no more than 8 hours and wt restrictions. Occasionally wakes up at night.   HTN: home BP numbers are ranging from 170-200/105-115. Denies CP, SOB, Palpitations. Checks BP on multiple personal cuffs.   The following portions of the patient's history were reviewed and updated as appropriate: allergies, current medications, past medical history, family and social history, and problem list.  Patient is a nonsmoker. Chews tobacco.   Past Medical History  Diagnosis Date  . Hypertension     Diagnosed 2000  . Hyperlipidemia     Diagnosed 2004  . Lateral femoral cutaneous neuropathy   . Sleep apnea     "dx'd; haven't took the sleep study yet" (08/16/2013)  . GERD (gastroesophageal reflux disease)   . Daily headache     "here lately" (08/16/2013)    ROS as above otherwise neg.    Medications reviewed. Current Outpatient Prescriptions  Medication Sig Dispense Refill  . aspirin EC 81 MG tablet Take 1 tablet (81 mg total) by mouth daily.      Marland Kitchen atorvastatin (LIPITOR) 80 MG tablet Take 1 tablet (80 mg total) by mouth daily at 6 PM.  30 tablet  0  . gabapentin (NEURONTIN) 100 MG capsule Take 1 capsule (100 mg total) by mouth 3 (three) times daily.  90 capsule  3  . lisinopril-hydrochlorothiazide (PRINZIDE,ZESTORETIC) 20-25 MG per tablet Take 1 tablet by mouth daily. for blood pressure  30 tablet  0  . metoprolol tartrate (LOPRESSOR) 25 MG tablet Take 1 tablet (25 mg total) by mouth 2 (two) times daily.  60 tablet  3  . Omeprazole ([facility] OMEPRAZOLE) 20 MG TBEC Take 1 tablet (20 mg total) by mouth every morning.  30 each  0   No current facility-administered medications for this visit.    Exam: BP 190/95  Pulse 73  Temp(Src) 98.4 F (36.9 C) (Oral)  Ht 5\' 6"  (1.676 m)  Wt 302 lb  (136.986 kg)  BMI 48.77 kg/m2 Gen: Well NAD HEENT: EOMI,  MMM MSK: FROM strength 5/5.   No results found for this or any previous visit (from the past 72 hour(s)).  A/P (as seen in Problem list)  Unilateral weakness - left arm/leg Resolved  Paresthesia and pain of extremity Persistent Unable to go to neurology Will start Gabapentin  HYPERTENSION, BENIGN SYSTEMIC Elevated at home and in office. Well above goal Increase Metop

## 2013-09-19 NOTE — Assessment & Plan Note (Signed)
Elevated at home and in office. Well above goal Increase Metop

## 2013-09-19 NOTE — Assessment & Plan Note (Signed)
Persistent Unable to go to neurology Will start Gabapentin

## 2015-12-23 ENCOUNTER — Emergency Department (HOSPITAL_COMMUNITY): Payer: Self-pay

## 2015-12-23 ENCOUNTER — Encounter (HOSPITAL_COMMUNITY): Payer: Self-pay

## 2015-12-23 DIAGNOSIS — R079 Chest pain, unspecified: Secondary | ICD-10-CM | POA: Insufficient documentation

## 2015-12-23 DIAGNOSIS — I10 Essential (primary) hypertension: Secondary | ICD-10-CM | POA: Insufficient documentation

## 2015-12-23 LAB — I-STAT TROPONIN, ED: Troponin i, poc: 0 ng/mL (ref 0.00–0.08)

## 2015-12-23 LAB — CBC
HEMATOCRIT: 44.5 % (ref 39.0–52.0)
Hemoglobin: 15.3 g/dL (ref 13.0–17.0)
MCH: 28.7 pg (ref 26.0–34.0)
MCHC: 34.4 g/dL (ref 30.0–36.0)
MCV: 83.5 fL (ref 78.0–100.0)
PLATELETS: 235 10*3/uL (ref 150–400)
RBC: 5.33 MIL/uL (ref 4.22–5.81)
RDW: 12.6 % (ref 11.5–15.5)
WBC: 8.9 10*3/uL (ref 4.0–10.5)

## 2015-12-23 LAB — BASIC METABOLIC PANEL
Anion gap: 12 (ref 5–15)
BUN: 12 mg/dL (ref 6–20)
CHLORIDE: 97 mmol/L — AB (ref 101–111)
CO2: 23 mmol/L (ref 22–32)
CREATININE: 1.01 mg/dL (ref 0.61–1.24)
Calcium: 10 mg/dL (ref 8.9–10.3)
Glucose, Bld: 166 mg/dL — ABNORMAL HIGH (ref 65–99)
POTASSIUM: 3.1 mmol/L — AB (ref 3.5–5.1)
SODIUM: 132 mmol/L — AB (ref 135–145)

## 2015-12-23 NOTE — ED Notes (Signed)
Onset 3 days constant chest tightness.  Onset 3 days ago and today had 4-5 hours of mid chest pain.  Checking BP at home and elevated.  Has not missed BP meds.  Pt has been sweaty x 2 days.  No shortness of breath, pain non radiating.

## 2015-12-23 NOTE — ED Notes (Signed)
Pt brought back to triage to do EKG.

## 2015-12-24 ENCOUNTER — Emergency Department (HOSPITAL_COMMUNITY)
Admission: EM | Admit: 2015-12-24 | Discharge: 2015-12-24 | Disposition: A | Discharge status: Home / Self Care | Payer: Self-pay | Attending: Emergency Medicine | Admitting: Emergency Medicine

## 2015-12-24 NOTE — ED Notes (Signed)
Pts name called for a room no answer 

## 2017-04-18 ENCOUNTER — Encounter (HOSPITAL_COMMUNITY): Payer: Self-pay | Admitting: Emergency Medicine

## 2017-04-18 DIAGNOSIS — I10 Essential (primary) hypertension: Secondary | ICD-10-CM | POA: Insufficient documentation

## 2017-04-18 DIAGNOSIS — Z9119 Patient's noncompliance with other medical treatment and regimen: Secondary | ICD-10-CM | POA: Insufficient documentation

## 2017-04-18 DIAGNOSIS — Z87891 Personal history of nicotine dependence: Secondary | ICD-10-CM | POA: Insufficient documentation

## 2017-04-18 DIAGNOSIS — Z7982 Long term (current) use of aspirin: Secondary | ICD-10-CM | POA: Insufficient documentation

## 2017-04-18 DIAGNOSIS — E785 Hyperlipidemia, unspecified: Secondary | ICD-10-CM | POA: Insufficient documentation

## 2017-04-18 NOTE — ED Triage Notes (Signed)
Pt reports chest pain since 9pm, that radiates to his neck and down to his leg. Pt denies n/v/d, abnormal SOB, diaphoresis, or weakness.  Reports he took his bp at home and it was elevated.

## 2017-04-19 ENCOUNTER — Emergency Department (HOSPITAL_COMMUNITY): Payer: Self-pay

## 2017-04-19 ENCOUNTER — Emergency Department (HOSPITAL_COMMUNITY)
Admission: EM | Admit: 2017-04-19 | Discharge: 2017-04-19 | Disposition: A | Discharge status: Home / Self Care | Payer: Self-pay | Attending: Emergency Medicine | Admitting: Emergency Medicine

## 2017-04-19 DIAGNOSIS — I10 Essential (primary) hypertension: Secondary | ICD-10-CM

## 2017-04-19 DIAGNOSIS — Z91199 Patient's noncompliance with other medical treatment and regimen due to unspecified reason: Secondary | ICD-10-CM

## 2017-04-19 DIAGNOSIS — Z9119 Patient's noncompliance with other medical treatment and regimen: Secondary | ICD-10-CM

## 2017-04-19 LAB — URINALYSIS, ROUTINE W REFLEX MICROSCOPIC
BILIRUBIN URINE: NEGATIVE
GLUCOSE, UA: NEGATIVE mg/dL
HGB URINE DIPSTICK: NEGATIVE
Ketones, ur: NEGATIVE mg/dL
Leukocytes, UA: NEGATIVE
Nitrite: NEGATIVE
PROTEIN: NEGATIVE mg/dL
Specific Gravity, Urine: 1.016 (ref 1.005–1.030)
pH: 7 (ref 5.0–8.0)

## 2017-04-19 LAB — BASIC METABOLIC PANEL
Anion gap: 8 (ref 5–15)
BUN: 8 mg/dL (ref 6–20)
CALCIUM: 9 mg/dL (ref 8.9–10.3)
CHLORIDE: 103 mmol/L (ref 101–111)
CO2: 25 mmol/L (ref 22–32)
CREATININE: 1.08 mg/dL (ref 0.61–1.24)
Glucose, Bld: 144 mg/dL — ABNORMAL HIGH (ref 65–99)
Potassium: 3.5 mmol/L (ref 3.5–5.1)
SODIUM: 136 mmol/L (ref 135–145)

## 2017-04-19 LAB — I-STAT CG4 LACTIC ACID, ED: LACTIC ACID, VENOUS: 1.11 mmol/L (ref 0.5–1.9)

## 2017-04-19 LAB — CBC
HCT: 41.2 % (ref 39.0–52.0)
Hemoglobin: 13.8 g/dL (ref 13.0–17.0)
MCH: 28.6 pg (ref 26.0–34.0)
MCHC: 33.5 g/dL (ref 30.0–36.0)
MCV: 85.3 fL (ref 78.0–100.0)
PLATELETS: 166 10*3/uL (ref 150–400)
RBC: 4.83 MIL/uL (ref 4.22–5.81)
RDW: 12.9 % (ref 11.5–15.5)
WBC: 9.9 10*3/uL (ref 4.0–10.5)

## 2017-04-19 LAB — HEPATIC FUNCTION PANEL
ALK PHOS: 71 U/L (ref 38–126)
ALT: 23 U/L (ref 17–63)
AST: 22 U/L (ref 15–41)
Albumin: 4 g/dL (ref 3.5–5.0)
TOTAL PROTEIN: 7.1 g/dL (ref 6.5–8.1)
Total Bilirubin: 0.4 mg/dL (ref 0.3–1.2)

## 2017-04-19 LAB — I-STAT TROPONIN, ED
TROPONIN I, POC: 0.01 ng/mL (ref 0.00–0.08)
TROPONIN I, POC: 0.01 ng/mL (ref 0.00–0.08)

## 2017-04-19 LAB — LIPASE, BLOOD: LIPASE: 33 U/L (ref 11–51)

## 2017-04-19 MED ORDER — SODIUM CHLORIDE 0.9 % IV BOLUS (SEPSIS)
2000.0000 mL | Freq: Once | INTRAVENOUS | Status: AC
  Administered 2017-04-19: 2000 mL via INTRAVENOUS

## 2017-04-19 MED ORDER — IOPAMIDOL (ISOVUE-370) INJECTION 76%
INTRAVENOUS | Status: AC
  Administered 2017-04-19: 100 mL via INTRAVENOUS
  Filled 2017-04-19: qty 100

## 2017-04-19 MED ORDER — ENALAPRIL-HYDROCHLOROTHIAZIDE 10-25 MG PO TABS: 1.0000 | ORAL_TABLET | Freq: Every day | ORAL | 0 refills | Status: DC

## 2017-04-19 MED ORDER — ACETAMINOPHEN 325 MG PO TABS
650.0000 mg | ORAL_TABLET | Freq: Once | ORAL | Status: AC
  Administered 2017-04-19: 650 mg via ORAL
  Filled 2017-04-19: qty 2

## 2017-04-19 NOTE — ED Provider Notes (Signed)
[facility]-EMERGENCY DEPT Provider Note   CSN: [id number] Arrival date & time: 04/18/17  2341  By signing my name below, I, Rosario AdieWilliam Andrew Hiatt, attest that this documentation has been prepared under the direction and in the presence of [employer] Corporationicole Kamaury Cutbirth, PA-C.  Electronically Signed: Rosario AdieWilliam Andrew Hiatt, ED Scribe. 04/19/17. 2:14 AM.  History   Chief Complaint Chief Complaint  Patient presents with  . Chest Pain   The history is provided by the patient. No language interpreter was used.   HPI Comments: Jesse Cook is a 31 y.o. male with a PMHx of GERD, HTN, HLD, and obesity, who presents to the Emergency Department complaining of intermittent episodes of left-sided and centralized chest pain beginning approximately five hours ago. He notes associated chills, fever ( Tmax 100.9) left-sided neck pain. He also notes an associated burning sensation to the left arm and torso. Pt reports that he woke up with his symptoms tonight from rest. His episodes of pain typically last 3-4 minutes each time. He took Tylenol at home without significant relief of his symptoms. Pt is currently unmedicated for his h/o HTN. Pt denies any known insect, arachnid, or tick bites. No known sick contacts. Pt is a non-smoker. He reports that he does have a known FHx of MI. No illicit drug usage. He denies cough, diarrhea, constipation, balance issues, rash, rhinorrhea, sore throat, shortess of breath, diaphoresis, weakness, abdominal pain, or any other associated symptoms.   Past Medical History:  Diagnosis Date  . Daily headache    "here lately" (08/16/2013)  . GERD (gastroesophageal reflux disease)   . Hyperlipidemia    Diagnosed 2004  . Hypertension    Diagnosed 2000  . Lateral femoral cutaneous neuropathy   . Sleep apnea    "dx'd; haven't took the sleep study yet" (08/16/2013)   Patient Active Problem List   Diagnosis Date Noted  . Apneic episode 08/24/2013  . Unilateral weakness - left arm/leg 08/16/2013  .  Chest pain, localized 08/16/2013  . Physical exam, annual 07/29/2011  . Paresthesia and pain of extremity 07/22/2011  . Low back pain 07/22/2011  . TOBACCO USER 07/12/2009  . HYPERLIPIDEMIA 01/15/2008  . SKIN TAG 03/09/2007  . OBESITY, NOS 12/01/2006  . HYPERTENSION, BENIGN SYSTEMIC 12/01/2006   Past Surgical History:  Procedure Laterality Date  . LYMPH NODE BIOPSY  1990's   "throat"     Home Medications    Prior to Admission medications   Medication Sig Start Date End Date Taking? Authorizing Provider  aspirin EC 81 MG tablet Take 1 tablet (81 mg total) by mouth daily. Patient not taking: Reported on 04/19/2017 08/17/13   Latrelle DodrillMcIntyre, Brittany J, MD  atorvastatin (LIPITOR) 80 MG tablet Take 1 tablet (80 mg total) by mouth daily at 6 PM. Patient not taking: Reported on 04/19/2017 08/17/13   Narda BondsNettey, Ralph A, MD  enalapril-hydrochlorothiazide (VASERETIC) 10-25 MG tablet Take 1 tablet by mouth daily. 04/19/17   Siana Panameno, Joni ReiningNicole, PA-C  gabapentin (NEURONTIN) 100 MG capsule Take 1 capsule (100 mg total) by mouth 3 (three) times daily. Patient not taking: Reported on 04/19/2017 09/19/13   Ozella RocksMerrell, David J, MD  lisinopril-hydrochlorothiazide (PRINZIDE,ZESTORETIC) 20-25 MG per tablet Take 1 tablet by mouth daily. for blood pressure Patient not taking: Reported on 04/19/2017 08/23/13   Narda BondsNettey, Ralph A, MD  metoprolol tartrate (LOPRESSOR) 25 MG tablet Take 1 tablet (25 mg total) by mouth 2 (two) times daily. Patient not taking: Reported on 04/19/2017 09/19/13   Ozella RocksMerrell, David J, MD  Omeprazole ([facility]  OMEPRAZOLE) 20 MG TBEC Take 1 tablet (20 mg total) by mouth every morning. Patient not taking: Reported on 04/19/2017 08/23/13   Narda Bonds, MD   Family History Family History  Problem Relation Age of Onset  . Diabetes Mother   . COPD Mother   . Heart disease Father 65       1st MI at 2, total 8 stents  . Hypertension Father   . Diabetes Father   . Hyperlipidemia Father   . Heart disease  Brother 32       2 blockages, no stents as of 2014  . Kidney disease Brother    Social History Social History  Substance Use Topics  . Smoking status: Former Smoker    Years: 1.00    Types: Cigarettes    Quit date: 07/19/2003  . Smokeless tobacco: Current User    Types: Chew     Comment: 08/16/2013 "pack of cigarettes lasted me a month; dips 2 cans/day"  . Alcohol use Yes     Comment: occ   Allergies   Patient has no known allergies.  Review of Systems Review of Systems  A complete review of systems was obtained and all systems are negative except as noted in the HPI and PMH.   Physical Exam Updated Vital Signs BP (!) 209/131   Pulse 86   Temp 98.6 F (37 C) (Oral)   Resp (!) 25   SpO2 100%   Physical Exam  Constitutional: He is oriented to person, place, and time. He appears well-developed and well-nourished. No distress.  HENT:  Head: Normocephalic and atraumatic.  Mouth/Throat: Oropharynx is clear and moist.  Eyes: Conjunctivae are normal.  Neck: Normal range of motion. No JVD present. No tracheal deviation present.  Full range of motion, no midline C-spine tenderness  Cardiovascular: Normal rate, regular rhythm and intact distal pulses.   Radial pulse equal bilaterally  Pulmonary/Chest: Effort normal and breath sounds normal. No stridor. No respiratory distress. He has no wheezes. He has no rales. He exhibits no tenderness.  Abdominal: Soft. He exhibits no distension and no mass. There is no tenderness. There is no rebound and no guarding.  Musculoskeletal: Normal range of motion. He exhibits no edema or tenderness.  No calf asymmetry, superficial collaterals, palpable cords, edema, Homans sign negative bilaterally.    Neurological: He is alert and oriented to person, place, and time.  Skin: Skin is warm. He is not diaphoretic. No pallor.  Psychiatric: He has a normal mood and affect. His behavior is normal.  Nursing note and vitals reviewed.  ED Treatments /  Results  DIAGNOSTIC STUDIES: Oxygen Saturation is 98% on RA, normal by my interpretation.   COORDINATION OF CARE: 2:14 AM-Discussed next steps with pt. Pt verbalized understanding and is agreeable with the plan.   Labs (all labs ordered are listed, but only abnormal results are displayed) Labs Reviewed  BASIC METABOLIC PANEL - Abnormal; Notable for the following:       Result Value   Glucose, Bld 144 (*)    All other components within normal limits  HEPATIC FUNCTION PANEL - Abnormal; Notable for the following:    Bilirubin, Direct <0.1 (*)    All other components within normal limits  CULTURE, BLOOD (ROUTINE X 2)  CULTURE, BLOOD (ROUTINE X 2)  CBC  URINALYSIS, ROUTINE W REFLEX MICROSCOPIC  LIPASE, BLOOD  I-STAT TROPOININ, ED  I-STAT CG4 LACTIC ACID, ED  I-STAT TROPOININ, ED  I-STAT CG4 LACTIC ACID, ED  EKG  EKG Interpretation  Date/Time:  Monday April 18 2017 23:44:36 EDT Ventricular Rate:  103 PR Interval:  184 QRS Duration: 96 QT Interval:  332 QTC Calculation: 434 R Axis:   25 Text Interpretation:  Sinus tachycardia Otherwise normal ECG Confirmed by Rochele Raring [phone]) on 04/19/2017 3:42:10 AM      Radiology Dg Chest 2 View  Result Date: 04/19/2017 CLINICAL DATA:  Initial evaluation for acute chest pain. EXAM: CHEST  2 VIEW COMPARISON:  The prior radiograph from 12/23/2015. FINDINGS: Cardiac and mediastinal silhouettes are stable in size and contour and remain within normal limits Lungs are hypoinflated. Diffuse vascular congestion, which may in part be related shallow lung inflation. No overt pulmonary edema. No pleural effusion. No focal infiltrates. No pneumothorax. No acute osseous abnormality. A situation of the normal thoracic kyphosis, stable. IMPRESSION: Shallow lung inflation with secondary diffuse bronchovascular crowding. No other active cardiopulmonary disease. Electronically Signed   By: Rise Mu M.D.   On: 04/19/2017 00:41   Ct Angio  Chest/abd/pel For Dissection W And/or Wo Contrast  Result Date: 04/19/2017 CLINICAL DATA:  Intermittent LEFT and central chest pain for 5 hours, fever and chills. Burning sensation LEFT arm and torso. History of hypertension, hyperlipidemia. EXAM: CT ANGIOGRAPHY CHEST, ABDOMEN AND PELVIS TECHNIQUE: Multidetector CT imaging through the chest, abdomen and pelvis was performed using the standard protocol during bolus administration of intravenous contrast. Multiplanar reconstructed images and MIPs were obtained and reviewed to evaluate the vascular anatomy. CONTRAST:  100 cc Isovue 370 COMPARISON:  None. FINDINGS: Large body habitus results in overall noisy image quality. CTA CHEST FINDINGS- non angiographic phase, limited. CARDIOVASCULAR: Thoracic aorta is normal course and caliber. No intrinsic density on noncontrast CT. Homogeneous contrast opacification of thoracic aorta without dissection, aneurysm, luminal irregularity, periaortic fluid collections. Heart size is normal. No pericardial effusion. MEDIASTINUM/NODES: No mediastinal mass or lymphadenopathy by CT size criteria. LUNGS/PLEURA: Tracheobronchial tree is patent, no pneumothorax. No pleural effusions, focal consolidations, pulmonary nodules or masses. MUSCULOSKELETAL: Non-suspicious. Included view of the mouth demonstrates of RIGHT maxillary periapical abscess. Review of the MIP images confirms the above findings. CTA ABDOMEN AND PELVIS FINDINGS- non angiographic phase, limited. VASCULAR Aorta: Abdominal aorta is normal course and caliber. Homogeneous contrast opacification of aortoiliac vessels without dissection, aneurysm, luminal irregularity, periaortic fluid collections. Celiac: Patent proximally. SMA: Patent proximally. Renals: Patent. IMA: Limited assessment due to non angiographic phase. Inflow: Negative. Veins: Negative, not tailored for evaluation. Review of the MIP images confirms the above findings. NON-VASCULAR HEPATOBILIARY: Liver and  gallbladder are normal. PANCREAS: Normal. SPLEEN: Normal. ADRENALS/URINARY TRACT: Kidneys are orthotopic, demonstrating symmetric enhancement. No nephrolithiasis, hydronephrosis or solid renal masses. The unopacified ureters are normal in course and caliber. Urinary bladder is partially distended and unremarkable. Normal adrenal glands. STOMACH/BOWEL: The stomach, small and large bowel are normal in course and caliber without inflammatory changes. Normal appendix. VASCULAR/LYMPHATIC: No lymphadenopathy by CT size criteria. REPRODUCTIVE: Normal. OTHER: No intraperitoneal free fluid or free air. MUSCULOSKELETAL: Nonacute. Accentuated thoracolumbar kyphosis with mild degenerative change. Review of the MIP images confirms the above findings. IMPRESSION: CTA CHEST: 1. Limited delayed phase, habitus limited examination. 2. No acute vascular process or acute cardiopulmonary disease. CTA ABDOMEN AND PELVIS: 1. Limited delayed phase, habitus limited examination. 2. No acute vascular process or or acute intra-abdominal/pelvic disease. Electronically Signed   By: Awilda Metro M.D.   On: 04/19/2017 05:00   Procedures Procedures   Medications Ordered in ED Medications  acetaminophen (TYLENOL) tablet 650 mg (650  mg Oral Given 04/19/17 0156)  sodium chloride 0.9 % bolus 2,000 mL (0 mLs Intravenous Stopped 04/19/17 0645)  iopamidol (ISOVUE-370) 76 % injection (100 mLs Intravenous Contrast Given 04/19/17 0414)   Initial Impression / Assessment and Plan / ED Course  I have reviewed the triage vital signs and the nursing notes.  Pertinent labs & imaging results that were available during my care of the patient were reviewed by me and considered in my medical decision making (see chart for details).      Medications  acetaminophen (TYLENOL) tablet 650 mg (650 mg Oral Given 04/19/17 0156)  sodium chloride 0.9 % bolus 2,000 mL (0 mLs Intravenous Stopped 04/19/17 0645)  iopamidol (ISOVUE-370) 76 % injection (100 mLs  Intravenous Contrast Given 04/19/17 0414)    Jesse Cook is 31 y.o. male presenting with Intermittent chest pain, resolved prior to coming to the ED, these episodes last for a few minutes. EKG with sinus tachycardia. Physical exam is not consistent with DVT. Patient is also febrile. No symptoms to suggest a source. Blood work reassuring with negative lactic acid no significant leukocytosis. Troponin is negative. LFTs and lipase negative. Patient with extremely elevated blood pressure, this patient states that he was taking 5 medications in an attempt to control his blood pressure. He self DC'd these. Dissection study negative. Repeat troponin negative. Patient observed in the ED for multiple hours with no return of his chest pain. We've had an extensive discussion on the importance of compliance with his hypertension medications and follow-up with primary care. He seen family practice in the past.  Evaluation does not show pathology that would require ongoing emergent intervention or inpatient treatment. Pt is hemodynamically stable and mentating appropriately. Discussed findings and plan with patient/guardian, who agrees with care plan. All questions answered. Return precautions discussed and outpatient follow up given.      Final Clinical Impressions(s) / ED Diagnoses   Final diagnoses:  Hypertension, unspecified type  Medical non-compliance    New Prescriptions Discharge Medication List as of 04/19/2017  7:11 AM    START taking these medications   Details  enalapril-hydrochlorothiazide (VASERETIC) 10-25 MG tablet Take 1 tablet by mouth daily., Starting Tue 04/19/2017, Print        personally performed the services described in this documentation, which was scribed in my presence. The recorded information has been reviewed and is accurate.     Kaylyn Lim 04/19/17 0758    Ward, Layla Maw, DO 04/19/17 2310

## 2017-04-19 NOTE — ED Notes (Signed)
ED Provider at bedside. 

## 2017-04-19 NOTE — ED Notes (Signed)
Patient transported to CT 

## 2017-04-19 NOTE — ED Notes (Signed)
Pt departed in NAD, refused use of wheelchair.  

## 2017-04-19 NOTE — Discharge Instructions (Signed)
Please make an appointment with family practice for evaluation and checkup. Don't hesitate to return to the emergency department for any new concerning or worsening symptoms.  Please take acetaminophen (Tylenol) every 4-6 hours for fever control.

## 2017-04-19 NOTE — ED Notes (Signed)
Pt requested sprite. RN check with provider who ok'd. Sprite given to patient

## 2017-04-24 LAB — CULTURE, BLOOD (ROUTINE X 2)
CULTURE: NO GROWTH
CULTURE: NO GROWTH
SPECIAL REQUESTS: ADEQUATE
Special Requests: ADEQUATE

## 2018-04-02 IMAGING — DX DG CHEST 2V
2 series · 2 of 2 positions shown · non-contrast
Comparison: The prior radiograph from 12/23/2015.

CLINICAL DATA: Initial evaluation for acute chest pain.

EXAM:
CHEST  2 VIEW

[chest pa]
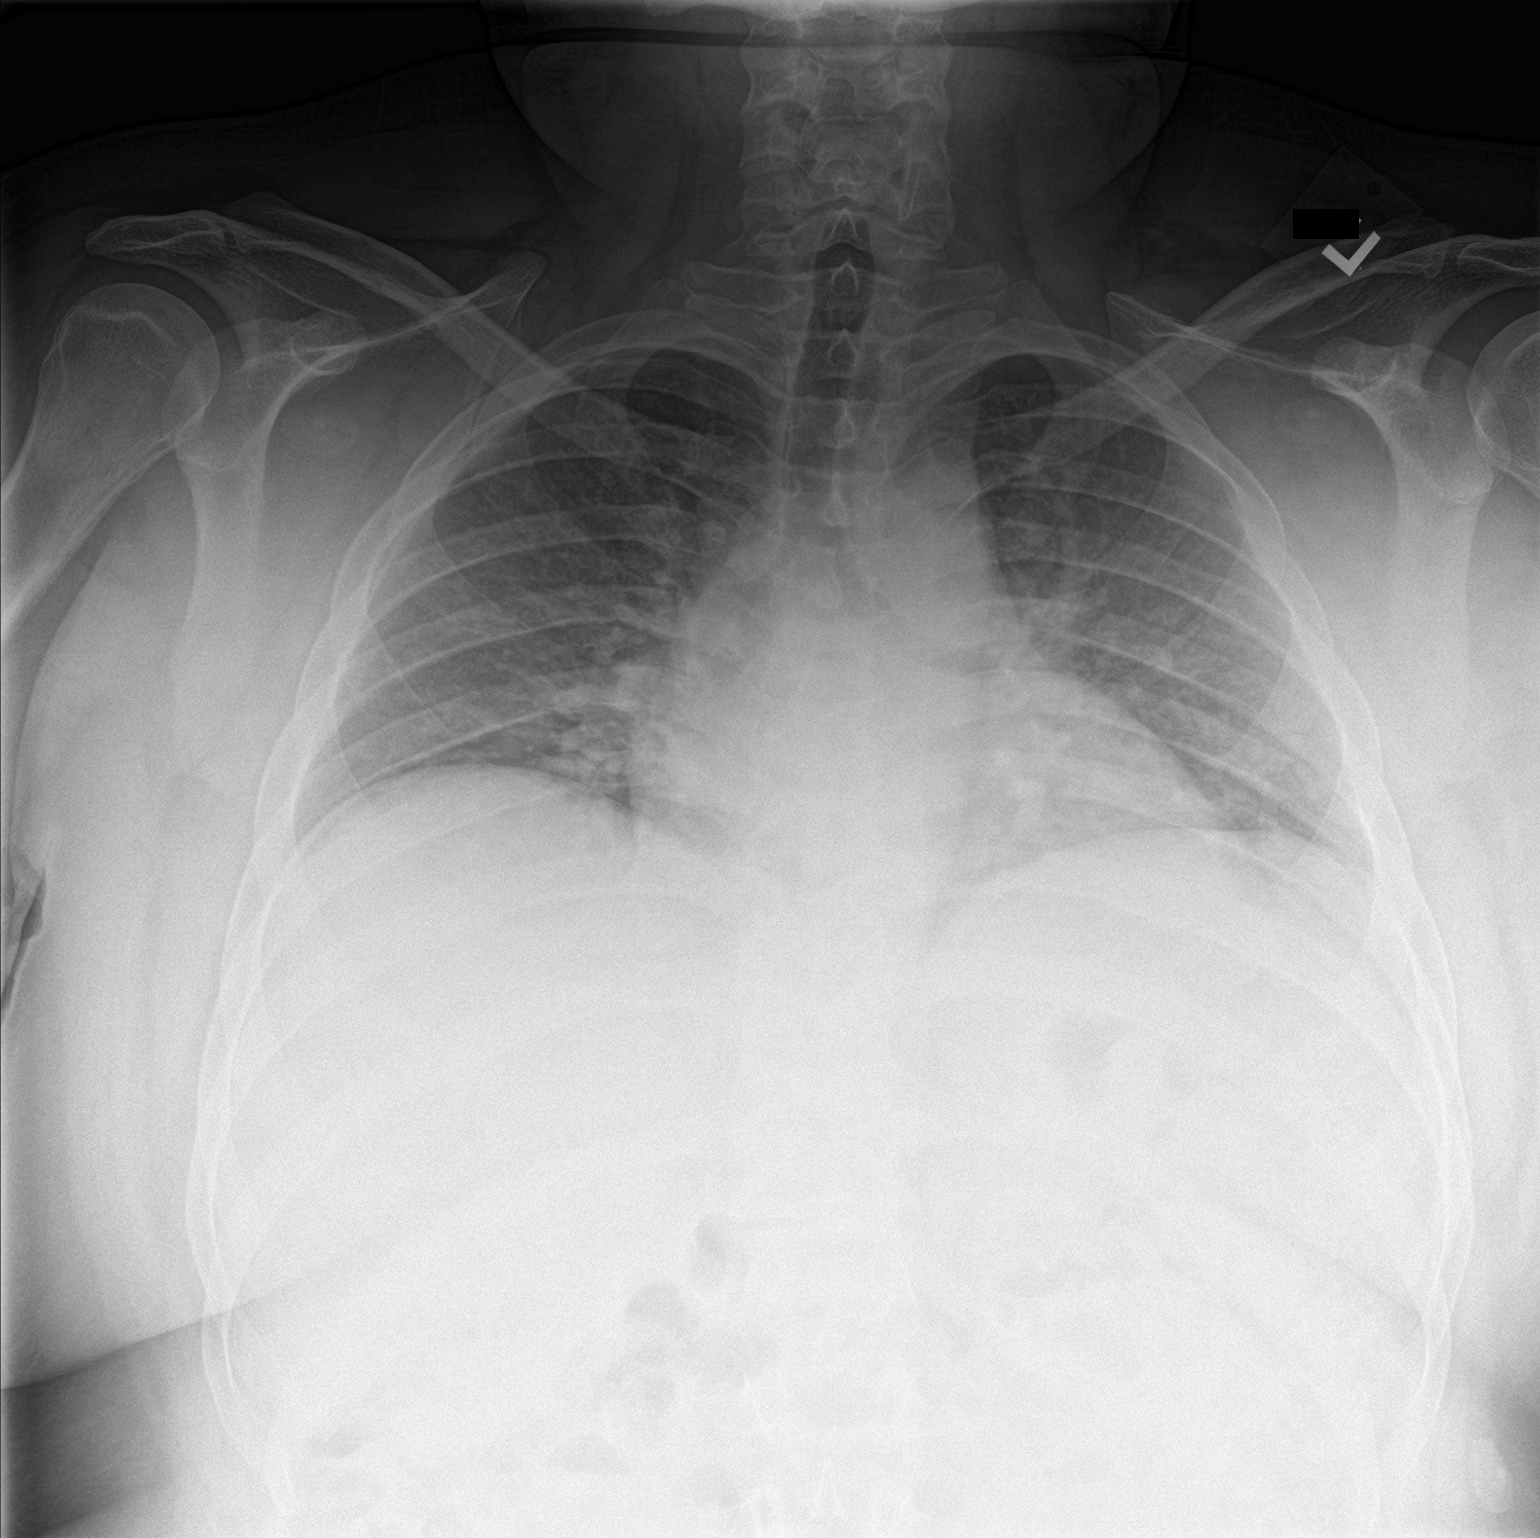

[chest lat]
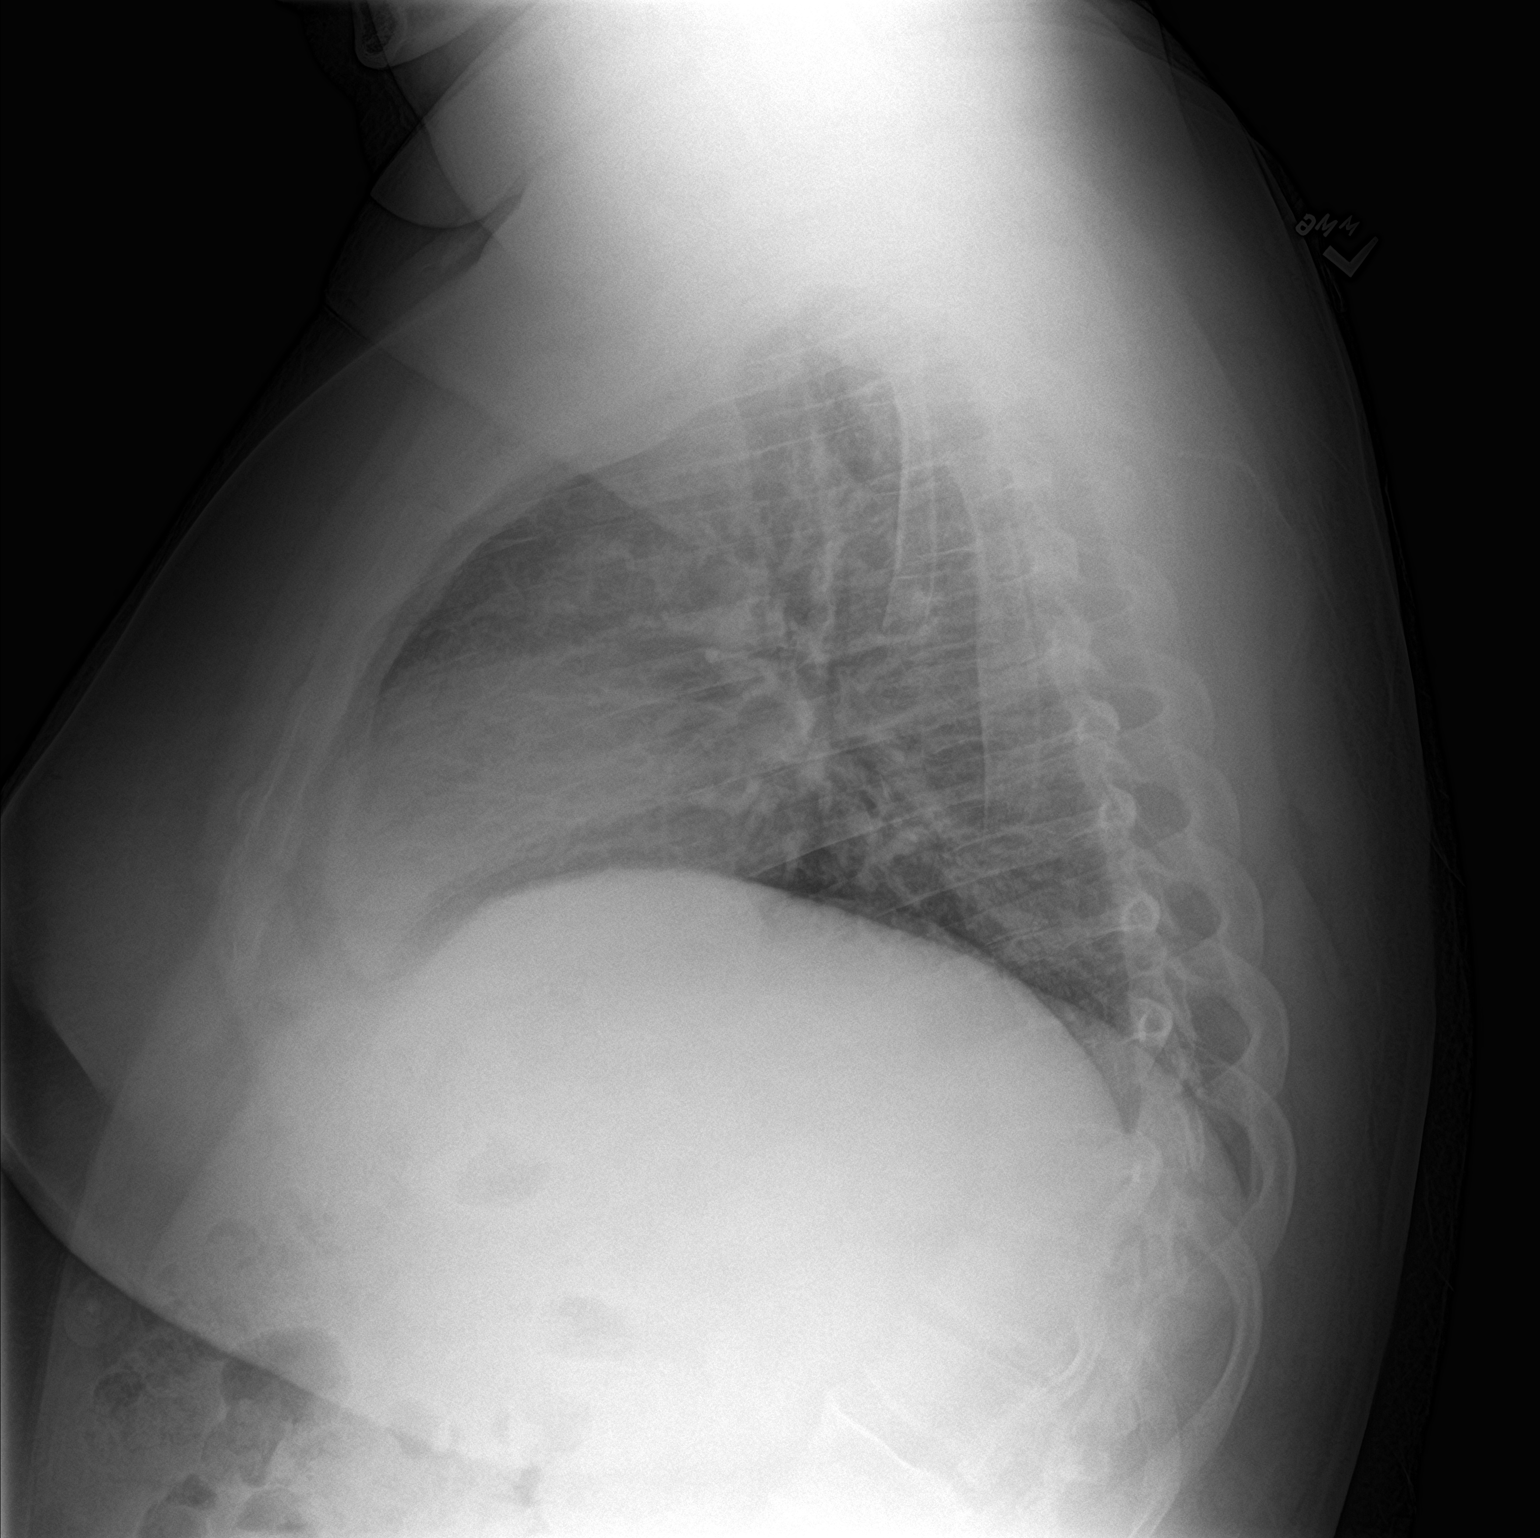

[2 of 2 positions shown; findings below may reference images not displayed]

FINDINGS: Cardiac and mediastinal silhouettes are stable in size and contour
and remain within normal limits

Lungs are hypoinflated. Diffuse vascular congestion, which may in
part be related shallow lung inflation. No overt pulmonary edema. No
pleural effusion. No focal infiltrates. No pneumothorax.

No acute osseous abnormality. A situation of the normal thoracic
kyphosis, stable.
IMPRESSION: Shallow lung inflation with secondary diffuse bronchovascular
crowding. No other active cardiopulmonary disease.

## 2018-04-02 IMAGING — CT CT ANGIO CHEST-ABD-PELV FOR DISSECTION W/ AND WO/W CM
2 of 8 series · 12 of 36 positions shown, 17 images · IV contrast (APPLIED)
Comparison: None.

CLINICAL DATA: Intermittent LEFT and central chest pain for 5
hours, fever and chills. Burning sensation LEFT arm and torso.
History of hypertension, hyperlipidemia.

EXAM:
CT ANGIOGRAPHY CHEST, ABDOMEN AND PELVIS
TECHNIQUE: Multidetector CT imaging through the chest, abdomen and pelvis was
performed using the standard protocol during bolus administration of
intravenous contrast. Multiplanar reconstructed images and MIPs were
obtained and reviewed to evaluate the vascular anatomy.
CONTRAST:  100 cc Isovue 370

[Series 9: thins · axial · 0.98mm/px · z∈[+876,+1484]mm · 11 of 1141 slices shown, 15 images]
[im 64/1141  mediastinal]
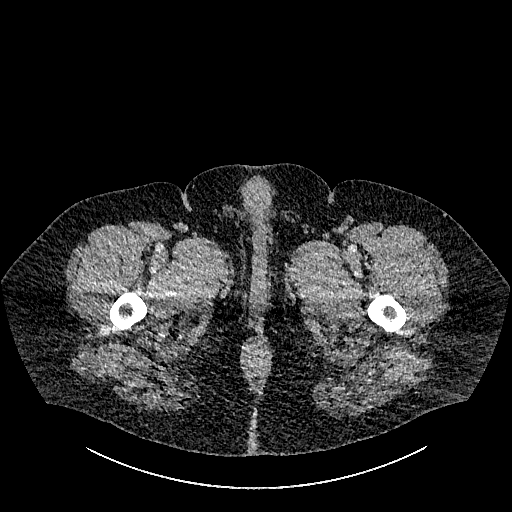
[im 64/1141  bone]
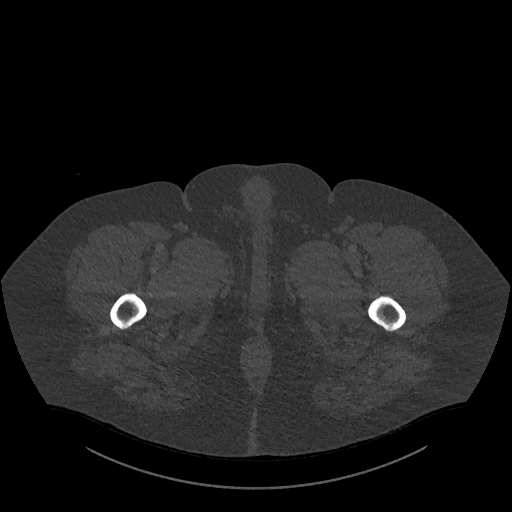
[im 191/1141  mediastinal]
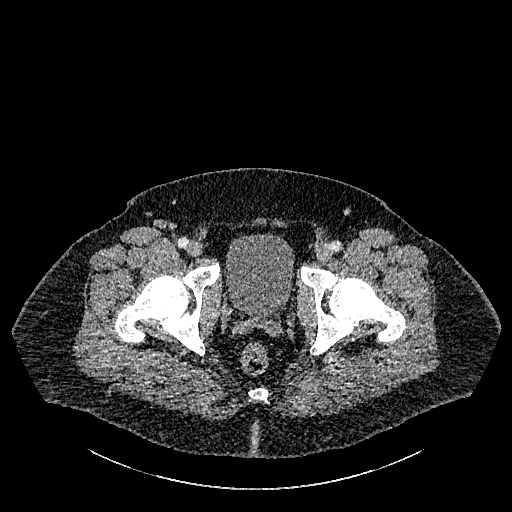
[im 317/1141  mediastinal]
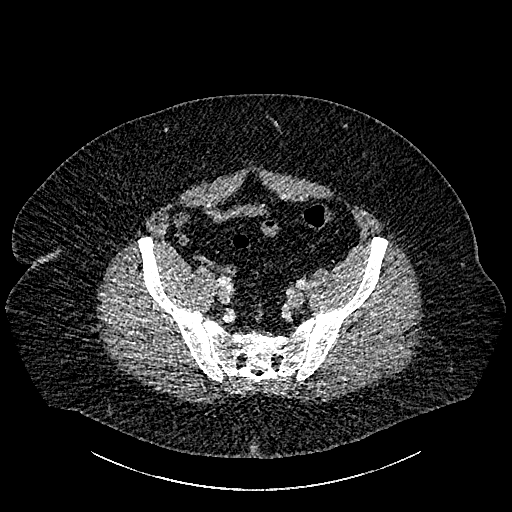
[im 444/1141  mediastinal]
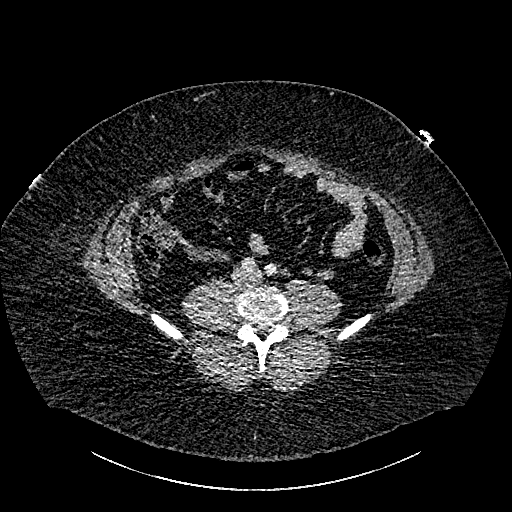
[im 571/1141  mediastinal]
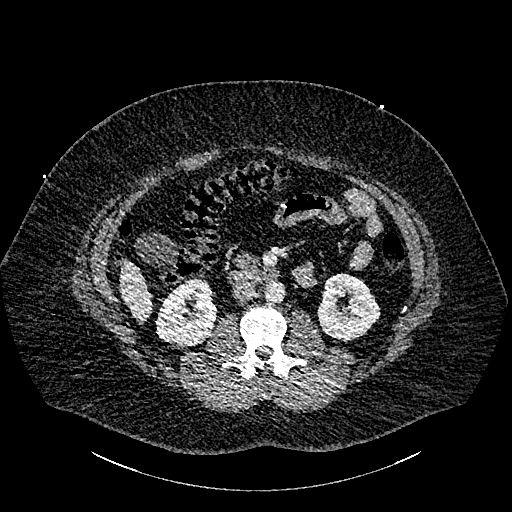
[im 697/1141  mediastinal]
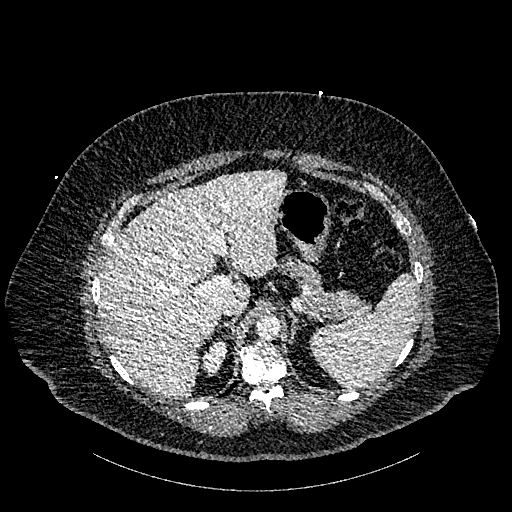
[im 824/1141  mediastinal]
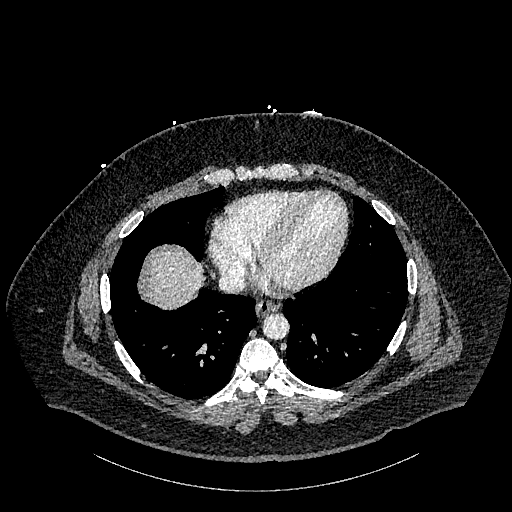
[im 887/1141  lung]
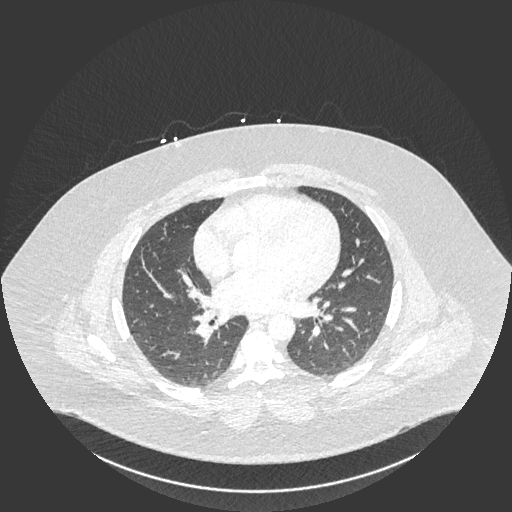
[im 951/1141  mediastinal]
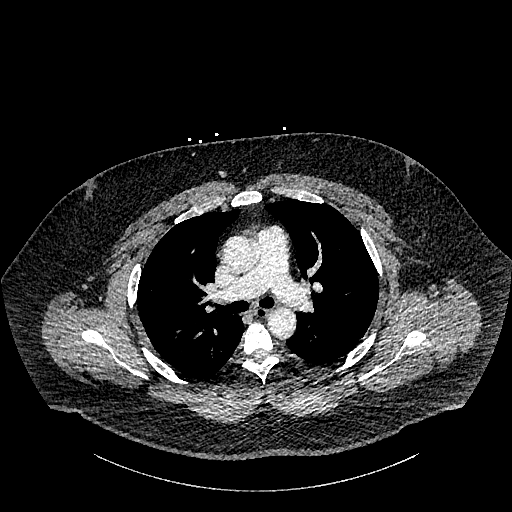
[im 951/1141  lung]
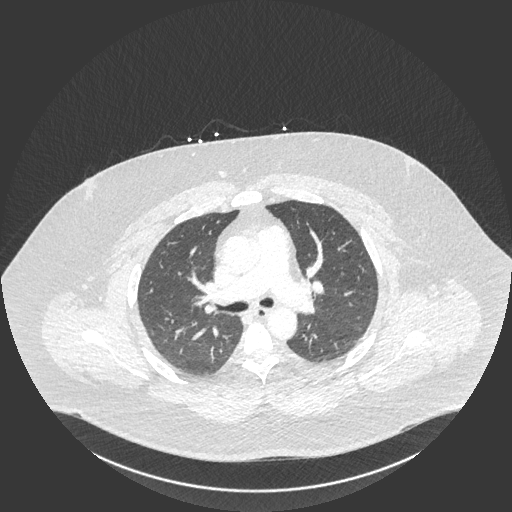
[im 1014/1141  lung]
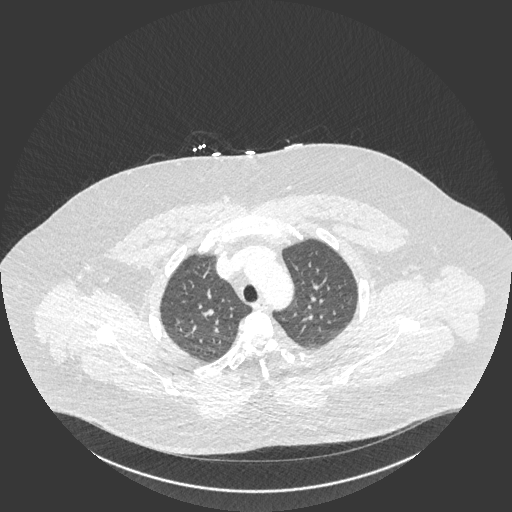
[im 1077/1141  mediastinal]
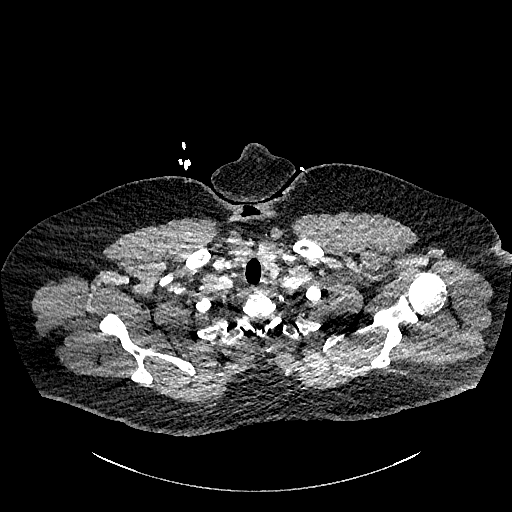
[im 1077/1141  lung]
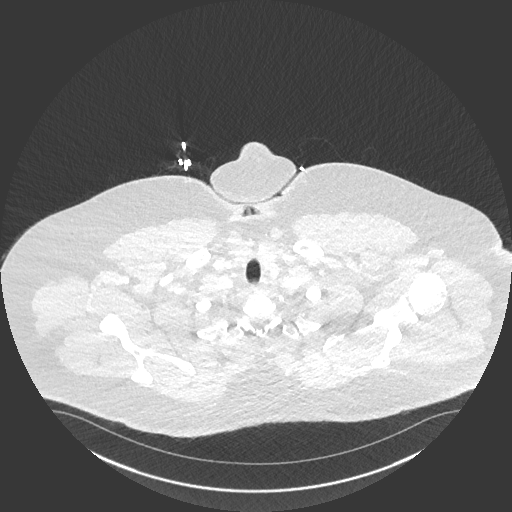
[im 1077/1141  bone]
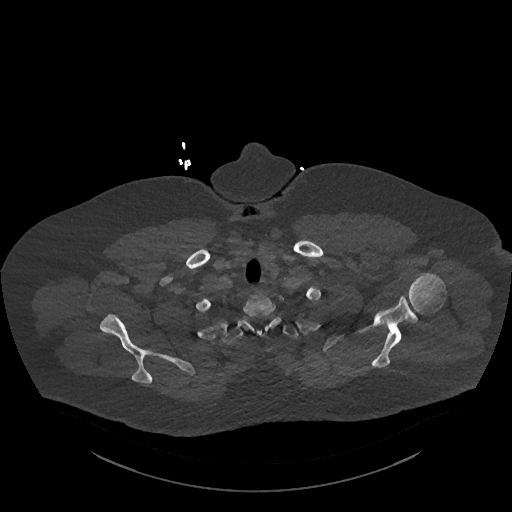

[Series 10: cor · coronal · 1.00mm/px · 1 of 199 slices shown, 2 images]
[im 100/199  mediastinal]
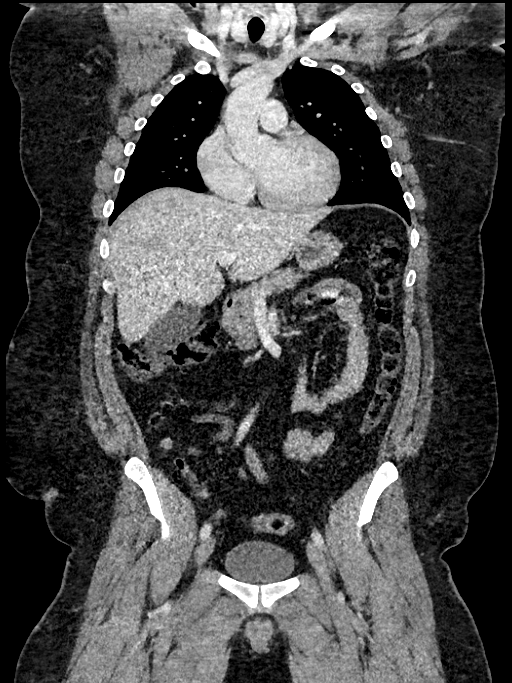
[im 100/199  bone]
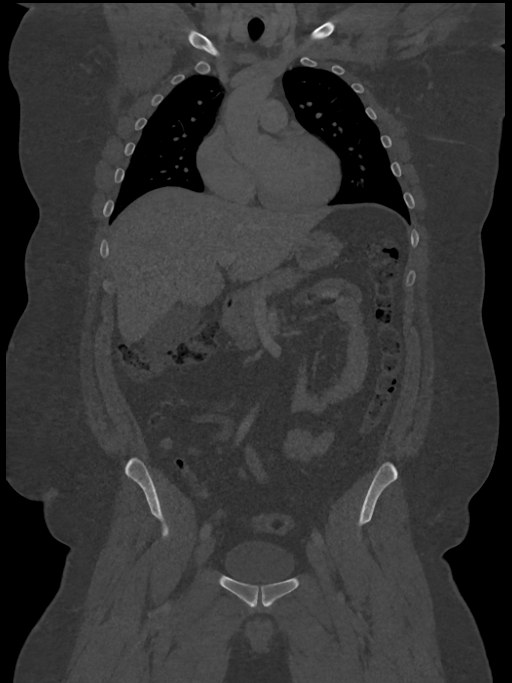

[12 of 36 positions shown; findings below may reference images not displayed]

FINDINGS: Large body habitus results in overall noisy image quality.

CTA CHEST FINDINGS- non angiographic phase, limited.

CARDIOVASCULAR: Thoracic aorta is normal course and caliber. No
intrinsic density on noncontrast CT. Homogeneous contrast
opacification of thoracic aorta without dissection, aneurysm,
luminal irregularity, periaortic fluid collections. Heart size is
normal. No pericardial effusion.

MEDIASTINUM/NODES: No mediastinal mass or lymphadenopathy by CT size
criteria.

LUNGS/PLEURA: Tracheobronchial tree is patent, no pneumothorax. No
pleural effusions, focal consolidations, pulmonary nodules or
masses.

MUSCULOSKELETAL: Non-suspicious. Included view of the mouth
demonstrates of RIGHT maxillary periapical abscess.

Review of the MIP images confirms the above findings.

CTA ABDOMEN AND PELVIS FINDINGS- non angiographic phase, limited.

VASCULAR

Aorta: Abdominal aorta is normal course and caliber. Homogeneous
contrast opacification of aortoiliac vessels without dissection,
aneurysm, luminal irregularity, periaortic fluid collections.

Celiac: Patent proximally.

SMA: Patent proximally.

Renals: Patent.

IMA: Limited assessment due to non angiographic phase.

Inflow: Negative.

Veins: Negative, not tailored for evaluation.

Review of the MIP images confirms the above findings.

NON-VASCULAR

HEPATOBILIARY: Liver and gallbladder are normal.

PANCREAS: Normal.

SPLEEN: Normal.

ADRENALS/URINARY TRACT: Kidneys are orthotopic, demonstrating
symmetric enhancement. No nephrolithiasis, hydronephrosis or solid
renal masses. The unopacified ureters are normal in course and
caliber. Urinary bladder is partially distended and unremarkable.
Normal adrenal glands.

STOMACH/BOWEL: The stomach, small and large bowel are normal in
course and caliber without inflammatory changes. Normal appendix.

VASCULAR/LYMPHATIC: No lymphadenopathy by CT size criteria.

REPRODUCTIVE: Normal.

OTHER: No intraperitoneal free fluid or free air.

MUSCULOSKELETAL: Nonacute. Accentuated thoracolumbar kyphosis with
mild degenerative change.

Review of the MIP images confirms the above findings.
IMPRESSION: CTA CHEST:

1. Limited delayed phase, habitus limited examination.
2. No acute vascular process or acute cardiopulmonary disease.

CTA ABDOMEN AND PELVIS:

1. Limited delayed phase, habitus limited examination.
2. No acute vascular process or or acute intra-abdominal/pelvic
disease.

## 2020-12-01 ENCOUNTER — Other Ambulatory Visit (HOSPITAL_COMMUNITY): Payer: Self-pay | Admitting: Neurology

## 2020-12-01 DIAGNOSIS — M5412 Radiculopathy, cervical region: Secondary | ICD-10-CM

## 2020-12-01 DIAGNOSIS — M5416 Radiculopathy, lumbar region: Secondary | ICD-10-CM

## 2023-01-05 ENCOUNTER — Encounter: Payer: Self-pay | Admitting: Gastroenterology

## 2023-01-06 ENCOUNTER — Ambulatory Visit: Payer: Self-pay | Admitting: Urology

## 2023-02-08 ENCOUNTER — Ambulatory Visit: Payer: BC Managed Care – PPO | Attending: Cardiovascular Disease | Admitting: Cardiovascular Disease

## 2023-02-08 ENCOUNTER — Encounter: Payer: Self-pay | Admitting: Cardiovascular Disease

## 2023-02-08 VITALS — BP 118/82 | HR 71 | Ht 64.0 in | Wt 303.4 lb

## 2023-02-08 DIAGNOSIS — Z8249 Family history of ischemic heart disease and other diseases of the circulatory system: Secondary | ICD-10-CM | POA: Diagnosis not present

## 2023-02-08 DIAGNOSIS — R079 Chest pain, unspecified: Secondary | ICD-10-CM | POA: Insufficient documentation

## 2023-02-08 DIAGNOSIS — G4733 Obstructive sleep apnea (adult) (pediatric): Secondary | ICD-10-CM | POA: Insufficient documentation

## 2023-02-08 DIAGNOSIS — Z6841 Body Mass Index (BMI) 40.0 and over, adult: Secondary | ICD-10-CM

## 2023-02-08 DIAGNOSIS — R072 Precordial pain: Secondary | ICD-10-CM

## 2023-02-08 DIAGNOSIS — I10 Essential (primary) hypertension: Secondary | ICD-10-CM | POA: Diagnosis not present

## 2023-02-08 LAB — BASIC METABOLIC PANEL
BUN/Creatinine Ratio: 16 (ref 9–20)
BUN: 15 mg/dL (ref 6–20)
CO2: 25 mmol/L (ref 20–29)
Calcium: 10.3 mg/dL — ABNORMAL HIGH (ref 8.7–10.2)
Chloride: 101 mmol/L (ref 96–106)
Creatinine, Ser: 0.94 mg/dL (ref 0.76–1.27)
Glucose: 97 mg/dL (ref 70–99)
Potassium: 4.7 mmol/L (ref 3.5–5.2)
Sodium: 139 mmol/L (ref 134–144)
eGFR: 106 mL/min/{1.73_m2} (ref >59)

## 2023-02-08 MED ORDER — METOPROLOL TARTRATE 100 MG PO TABS: ORAL_TABLET | ORAL | 0 refills | Status: DC

## 2023-02-08 MED ORDER — METOPROLOL TARTRATE 100 MG PO TABS: 100.0000 mg | ORAL_TABLET | Freq: Once | ORAL | 0 refills | Status: DC

## 2023-02-08 NOTE — Assessment & Plan Note (Signed)
History of hyperlipidemia on high-dose statin therapy with lipid profile performed 01/04/2023 revealing a total cholesterol of 170, LDL of 111 and HDL of 33.  Will check a coronary calcium score/coronary CTA to determine how aggressive to be with resector modification.

## 2023-02-08 NOTE — Patient Instructions (Signed)
Medication Instructions:  Your physician recommends that you continue on your current medications as directed. Please refer to the Current Medication list given to you today.  *If you need a refill on your cardiac medications before your next appointment, please call your pharmacy*   Lab Work: Your physician recommends that you have labs drawn today: BMET  If you have labs (blood work) drawn today and your tests are completely normal, you will receive your results only by: MyChart Message (if you have MyChart) OR A paper copy in the mail If you have any lab test that is abnormal or we need to change your treatment, we will call you to review the results.   Testing/Procedures: Your physician has requested that you have an echocardiogram. Echocardiography is a painless test that uses sound waves to create images of your heart. It provides your doctor with information about the size and shape of your heart and how well your heart's chambers and valves are working. This procedure takes approximately one hour. There are no restrictions for this procedure. Please do NOT wear cologne, perfume, aftershave, or lotions (deodorant is allowed). Please arrive 15 minutes prior to your appointment time.    Follow-Up: At [facility], you and your health needs are our priority.  As part of our continuing mission to provide you with exceptional heart care, we have created designated Provider Care Teams.  These Care Teams include your primary Cardiologist (physician) and Advanced Practice Providers (APPs -  Physician Assistants and Nurse Practitioners) who all work together to provide you with the care you need, when you need it.  We recommend signing up for the patient portal called "MyChart".  Sign up information is provided on this After Visit Summary.  MyChart is used to connect with patients for Virtual Visits (Telemedicine).  Patients are able to view lab/test results, encounter notes, upcoming  appointments, etc.  Non-urgent messages can be sent to your provider as well.   To learn more about what you can do with MyChart, go to [email or web address].    Your next appointment:   3 month(s)  Provider:   Nanetta Batty, MD    Other Instructions   Your cardiac CT will be scheduled at one of the below locations:   [facility] [address] [place], [place] [address] [phone]   If scheduled at [facility], please arrive at the [facility] Entrance (Entrance C2) of [facility] 30 minutes prior to test start time. You can use the FREE valet parking offered at entrance C (encouraged to control the heart rate for the test)  Proceed to the [facility] Radiology Department (first floor) to check-in and test prep.  All radiology patients and guests should use entrance C2 at [facility], accessed from [facility], even though the hospital's physical address listed is [address].     Please follow these instructions carefully (unless otherwise directed):  Hold all erectile dysfunction medications at least 3 days (72 hrs) prior to test. (Ie viagra, cialis, sildenafil, tadalafil, etc) We will administer nitroglycerin during this exam.   On the Night Before the Test: Be sure to Drink plenty of water. Do not consume any caffeinated/decaffeinated beverages or chocolate 12 hours prior to your test. Do not take any antihistamines 12 hours prior to your test.   On the Day of the Test: Drink plenty of water until 1 hour prior to the test. Do not eat any food 1  hour prior to test. You may take your regular medications prior to the test.  Take metoprolol (Lopressor) 100mg  two hours prior to test. If you take Furosemide/Hydrochlorothiazide/Spironolactone, please HOLD on the morning of the test.       After the Test: Drink plenty of water. After receiving IV contrast, you may experience a mild flushed  feeling. This is normal. On occasion, you may experience a mild rash up to 24 hours after the test. This is not dangerous. If this occurs, you can take Benadryl 25 mg and increase your fluid intake. If you experience trouble breathing, this can be serious. If it is severe call 911 IMMEDIATELY. If it is mild, please call our office. If you take any of these medications: Glipizide/Metformin, Avandament, Glucavance, please do not take 48 hours after completing test unless otherwise instructed.  We will call to schedule your test 2-4 weeks out understanding that some insurance companies will need an authorization prior to the service being performed.   For non-scheduling related questions, please contact the cardiac imaging nurse navigator should you have any questions/concerns: Rockwell Alexandria, Cardiac Imaging Nurse Navigator Larey Brick, Cardiac Imaging Nurse Navigator [facility] Direct Office Dial: [phone]   For scheduling needs, including cancellations and rescheduling, please call [place], [phone].

## 2023-02-08 NOTE — Assessment & Plan Note (Signed)
History of essential hypertension a blood pressure measured today at 118/82.  He is on lisinopril.

## 2023-02-08 NOTE — Assessment & Plan Note (Signed)
His father, [name], who is also a patient of mine had coronary intervention 30 years ago.

## 2023-02-08 NOTE — Progress Notes (Signed)
02/08/2023 Jesse Cook [facility]   08/24/1986  [id number]  Primary Physician Bucio, Jesse Reil, FNP Primary Cardiologist: Jesse Gess MD Jesse Cook, [place]  HPI:  Jesse Cook is a 37 y.o. morbidly overweight married Caucasian male with no children who was referred to me by the emergency room at [facility] after recently being seen on 01/04/2023 for chest pain.  His father, Jesse Cook, is also a patient of mine.  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  His risk factors include treated hypertension, diabetes and hyperlipidemia.  He does have family history of a father who had coronary intervention 30 years ago in his late 67s.  He has never had a heart attack but had question TIA when he was seen recently with CT abnormalities.  He had chest pain which is fairly infrequent and he will his blood pressure was under better control Cook the last year.  He was seen at [facility] on 4/2 with chest pain, left facial and upper extremity numbness.  He ruled out for microinfarction.  CT scan did show although Cooner infarcts.   Current Meds  Medication Sig   aspirin EC 81 MG tablet Take 1 tablet (81 mg total) by mouth daily.   atorvastatin (LIPITOR) 80 MG tablet Take 1 tablet (80 mg total) by mouth daily at 6 PM. (Patient taking differently: Take 40 mg by mouth daily at 6 PM.)   cholecalciferol (VITAMIN D3) 25 MCG (1000 UNIT) tablet Take 1,000 Units by mouth daily.   empagliflozin (JARDIANCE) 10 MG TABS tablet Take by mouth daily.   ibuprofen (ADVIL) 800 MG tablet Take 800 mg by mouth every 8 (eight) hours as needed.   lisinopril (ZESTRIL) 10 MG tablet Take 10 mg by mouth daily.   oxybutynin (DITROPAN-XL) 5 MG 24 hr tablet Take 5 mg by mouth at bedtime.   tirzepatide [facility]) 2.5 MG/0.5ML Pen Inject 2.5 mg into the skin once a week.     Allergies  Allergen Reactions   Metformin And Related Swelling    Swelling of mouth      Social History   Socioeconomic History   Marital status: Married    Spouse name: Not on file   Number of children: Not on file   Years of education: Not on file   Highest education level: Not on file  Occupational History   Occupation: Sports administrator: [employer]  Tobacco Use   Smoking status: Former    Years: 1    Types: Cigarettes    Quit date: 07/19/2003    Years since quitting: 19.5   Smokeless tobacco: Current    Types: Chew   Tobacco comments:    08/16/2013 "pack of cigarettes lasted me a month; dips 2 cans/day"  Substance and Sexual Activity   Alcohol use: Yes    Comment: occ   Drug use: No   Sexual activity: Yes  Other Topics Concern   Not on file  Social History Narrative   Pt lives with wife.    Social Determinants of Health   Financial Resource Strain: Not on file  Food Insecurity: Not on file  Transportation Needs: Not on file  Physical Activity: Not on file  Stress: Not on file  Social Connections: Not on file  Intimate Partner Violence: Not on file     Review of Systems: General: negative for chills, fever, night  sweats or weight changes.  Cardiovascular: negative for chest pain, dyspnea on exertion, edema, orthopnea, palpitations, paroxysmal nocturnal dyspnea or shortness of breath Dermatological: negative for rash Respiratory: negative for cough or wheezing Urologic: negative for hematuria Abdominal: negative for nausea, vomiting, diarrhea, bright red blood per rectum, melena, or hematemesis Neurologic: negative for visual changes, syncope, or dizziness All other systems reviewed and are otherwise negative except as noted above.    Blood pressure 118/82, pulse 71, height 5\' 4"  (1.626 m), weight (!) 303 lb 6.4 oz (137.6 kg), SpO2 95 %.  General appearance: alert and no distress Neck: no adenopathy, no carotid bruit, no JVD, supple, symmetrical, trachea midline, and thyroid not enlarged, symmetric, no tenderness/mass/nodules Lungs:  clear to auscultation bilaterally Heart: regular rate and rhythm, S1, S2 normal, no murmur, click, rub or gallop Extremities: extremities normal, atraumatic, no cyanosis or edema Pulses: 2+ and symmetric Skin: Skin color, texture, turgor normal. No rashes or lesions Neurologic: Grossly normal  EKG sinus rhythm at 71 without ST or T wave changes.  Personally reviewed this EKG.  ASSESSMENT AND PLAN:   HYPERLIPIDEMIA History of hyperlipidemia on high-dose statin therapy with lipid profile performed 01/04/2023 revealing a total cholesterol of 170, LDL of 111 and HDL of 33.  Will check a coronary calcium score/coronary CTA to determine how aggressive to be with resector modification.  OBESITY, NOS History of morbid obesity with a BMI of 52 on a GLP-1 agonist (Mounjaro) with 20 pound weight loss so far.  His weight goal is in the lower 200 range.  HYPERTENSION, BENIGN SYSTEMIC History of essential hypertension a blood pressure measured today at 118/82.  He is on lisinopril.  Obstructive sleep apnea History of obstructive sleep on CPAP.  Family history of heart disease His father, Jesse Cook, who is also a patient of mine had coronary intervention 30 years ago.  Chest pain of uncertain etiology Jesse Cook has had chest pain fairly reproducibly ongoing for couple when he his blood pressure was less well-controlled a year ago.  He was recently seen at [facility] for chest pain and neurologic symptoms.  Myocardial infarction was ruled out.  He does have risk factors including treated diabetes, hypertension, hyperlipidemia and family history.  I am going to get a 2D echo and a coronary CTA to further evaluate.     Jesse Gess MD FACP,FACC,FAHA, [facility] 02/08/2023 11:07 AM

## 2023-02-08 NOTE — Assessment & Plan Note (Signed)
Jesse Cook has had chest pain fairly reproducibly ongoing for couple when he his blood pressure was less well-controlled a year ago.  He was recently seen at [facility] for chest pain and neurologic symptoms.  Myocardial infarction was ruled out.  He does have risk factors including treated diabetes, hypertension, hyperlipidemia and family history.  I am going to get a 2D echo and a coronary CTA to further evaluate.

## 2023-02-08 NOTE — Assessment & Plan Note (Signed)
History of obstructive sleep on CPAP.

## 2023-02-08 NOTE — Assessment & Plan Note (Signed)
History of morbid obesity with a BMI of 52 on a GLP-1 agonist (Mounjaro) with 20 pound weight loss so far.  His weight goal is in the lower 200 range.

## 2023-02-15 ENCOUNTER — Telehealth (HOSPITAL_COMMUNITY): Payer: Self-pay | Admitting: *Deleted

## 2023-02-15 NOTE — Telephone Encounter (Signed)
Reaching out to patient to offer assistance regarding upcoming cardiac imaging study; pt verbalizes understanding of appt date/time, parking situation and where to check in, pre-test NPO status and medications ordered, and verified current allergies; name and call back number provided for further questions should they arise  Bhumi Godbey RN Navigator Cardiac Imaging [facility] [phone] office [phone] cell  Patient to take 100mg metoprolol tartrate two hours prior to his cardiac CT scan. He is aware to arrive at 8:30am. 

## 2023-02-16 ENCOUNTER — Ambulatory Visit (HOSPITAL_COMMUNITY)
Admission: RE | Admit: 2023-02-16 | Discharge: 2023-02-16 | Disposition: A | Discharge status: Home / Self Care | Payer: BC Managed Care – PPO | Source: Ambulatory Visit | Attending: Cardiovascular Disease | Admitting: Cardiovascular Disease

## 2023-02-16 DIAGNOSIS — R072 Precordial pain: Secondary | ICD-10-CM | POA: Diagnosis present

## 2023-02-16 MED ORDER — NITROGLYCERIN 0.4 MG SL SUBL
SUBLINGUAL_TABLET | SUBLINGUAL | Status: AC
  Filled 2023-02-16: qty 2

## 2023-02-16 MED ORDER — NITROGLYCERIN 0.4 MG SL SUBL
0.8000 mg | SUBLINGUAL_TABLET | Freq: Once | SUBLINGUAL | Status: AC
  Administered 2023-02-16: 0.8 mg via SUBLINGUAL

## 2023-02-16 MED ORDER — IOHEXOL 350 MG/ML SOLN
110.0000 mL | Freq: Once | INTRAVENOUS | Status: AC | PRN
  Administered 2023-02-16: 110 mL via INTRAVENOUS

## 2023-02-24 ENCOUNTER — Encounter: Payer: Self-pay | Admitting: Neurology

## 2023-02-24 ENCOUNTER — Ambulatory Visit: Payer: BC Managed Care – PPO | Admitting: Neurology

## 2023-02-24 ENCOUNTER — Telehealth: Payer: Self-pay | Admitting: Neurology

## 2023-02-24 VITALS — BP 116/80 | Ht 64.0 in | Wt 301.0 lb

## 2023-02-24 DIAGNOSIS — I6381 Other cerebral infarction due to occlusion or stenosis of small artery: Secondary | ICD-10-CM | POA: Diagnosis not present

## 2023-02-24 DIAGNOSIS — G459 Transient cerebral ischemic attack, unspecified: Secondary | ICD-10-CM | POA: Diagnosis not present

## 2023-02-24 NOTE — Telephone Encounter (Signed)
Jesse Cook: [id number] exp. 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

## 2023-02-24 NOTE — Patient Instructions (Signed)
MRI brain without contrast, I will contact you to go over the results Continue your other medications Continue follow-up PCP Return as needed.

## 2023-02-24 NOTE — Progress Notes (Signed)
[facility]  PATIENT: Jesse Cook DOB: 12/05/1985  REQUESTING CLINICIAN: Beverely Pace, MD HISTORY FROM: Patient and spouse  REASON FOR VISIT: TIA    HISTORICAL  CHIEF COMPLAINT:  Chief Complaint  Patient presents with   New Patient (Initial Visit)    Rm 12, with wife Okey Regal, NP TIA, since hospital stay, when sitting for periods, bilateral tingling/numbness in feet and lower legs.    HISTORY OF PRESENT ILLNESS:  This is a 36-year gentleman past medical history of hypertension, hyperlipidemia, obesity, prediabetes, and previous strokes, right thalamic and left basal ganglia who is presenting after being admitted for TIA.  Patient presented to the ED after waking up with left face and left arm numbness.  Denies any weakness.  He was admitted for TIA/stroke workup.  His head CT was negative for any acute stroke but did show previous right thalamic and left basal ganglia strokes.  CT angiogram did not show any large vessel occlusion.  He was unable to perform the MRI, and his LDL was 111 with hemoglobin A1c of 6.2.  He was discharged home to continue medications and follow-up with neurology.  He reports since discharge from home he has been consistent with aspirin 81 mg and consistent with also his medications.  He reports numbness of the face and left arm got better the next day.  Currently he is asymptomatic.   OTHER MEDICAL CONDITIONS: Obesity, Hypertension, Hyperlipidemia, prediabetes and previous thalamic stroke   REVIEW OF SYSTEMS: Full 14 system review of systems performed and negative with exception of: As noted in the HPI   ALLERGIES: Allergies  Allergen Reactions   Metformin And Related Swelling    Swelling of mouth     HOME MEDICATIONS: Outpatient Medications Prior to Visit  Medication Sig Dispense Refill   aspirin EC 81 MG tablet Take 1 tablet (81 mg total) by mouth daily.     atorvastatin (LIPITOR) 80 MG tablet Take 1 tablet (80 mg total) by  mouth daily at 6 PM. (Patient taking differently: Take 40 mg by mouth daily at 6 PM.) 30 tablet 0   empagliflozin (JARDIANCE) 10 MG TABS tablet Take by mouth daily.     ergocalciferol (VITAMIN D2) 1.25 MG (50000 UT) capsule Take 50,000 Units by mouth once a week.     ibuprofen (ADVIL) 800 MG tablet Take 800 mg by mouth every 8 (eight) hours as needed.     lisinopril (ZESTRIL) 10 MG tablet Take 10 mg by mouth daily.     loratadine (CLARITIN REDITABS) 10 MG dissolvable tablet Take 10 mg by mouth daily.     oxybutynin (DITROPAN-XL) 5 MG 24 hr tablet Take 5 mg by mouth at bedtime.     tirzepatide [facility]) 2.5 MG/0.5ML Pen Inject 2.5 mg into the skin once a week.     cholecalciferol (VITAMIN D3) 25 MCG (1000 UNIT) tablet Take 1,000 Units by mouth daily. (Patient not taking: Reported on 02/24/2023)     metoprolol tartrate (LOPRESSOR) 100 MG tablet Take 1 tablet (100mg ) TWO hours prior to CT scan (Patient not taking: Reported on 02/24/2023) 1 tablet 0   No facility-administered medications prior to visit.    PAST MEDICAL HISTORY: Past Medical History:  Diagnosis Date   Carpal tunnel syndrome, bilateral upper limbs    Daily headache    "here lately" (08/16/2013)   GERD (gastroesophageal reflux disease)    Hyperlipidemia    Diagnosed 2004   Hypertension    Diagnosed 2000   Lateral femoral  cutaneous neuropathy    Sleep apnea    "dx'd; haven't took the sleep study yet" (08/16/2013)   Sleep apnea    Stroke (HCC)     PAST SURGICAL HISTORY: Past Surgical History:  Procedure Laterality Date   LYMPH NODE BIOPSY  1990's   "throat"     FAMILY HISTORY: Family History  Problem Relation Age of Onset   Diabetes Mother    COPD Mother    Seizures Mother    Heart disease Father 30       1st MI at 70, total 8 stents   Hypertension Father    Diabetes Father    Hyperlipidemia Father    Heart disease Brother 42       2 blockages, no stents as of 2014   Kidney disease Brother     SOCIAL  HISTORY: Social History   Socioeconomic History   Marital status: Married    Spouse name: Not on file   Number of children: Not on file   Years of education: Not on file   Highest education level: Not on file  Occupational History   Occupation: Sports administrator: [employer]  Tobacco Use   Smoking status: Former    Years: 1    Types: Cigarettes    Quit date: 07/19/2003    Years since quitting: 19.6   Smokeless tobacco: Current    Types: Chew   Tobacco comments:    08/16/2013 "pack of cigarettes lasted me a month; dips 2 cans/day"  Vaping Use   Vaping Use: Some days   Substances: Nicotine  Substance and Sexual Activity   Alcohol use: Yes    Comment: occ   Drug use: No   Sexual activity: Yes  Other Topics Concern   Not on file  Social History Narrative   Pt lives with wife.    Right handed   Caffeine 40oz daily   Social Determinants of Health   Financial Resource Strain: Not on file  Food Insecurity: Not on file  Transportation Needs: Not on file  Physical Activity: Not on file  Stress: Not on file  Social Connections: Not on file  Intimate Partner Violence: Not on file    PHYSICAL EXAM  GENERAL EXAM/CONSTITUTIONAL: Vitals:  Vitals:   02/24/23 0836  BP: 116/80  Weight: (!) 301 lb (136.5 kg)  Height: 5\' 4"  (1.626 m)   Body mass index is 51.67 kg/m. Wt Readings from Last 3 Encounters:  02/24/23 (!) 301 lb (136.5 kg)  02/08/23 (!) 303 lb 6.4 oz (137.6 kg)  09/19/13 (!) 302 lb (137 kg)   Patient is in no distress; well developed, nourished and groomed; neck is supple  MUSCULOSKELETAL: Gait, strength, tone, movements noted in Neurologic exam below  NEUROLOGIC: MENTAL STATUS:      No data to display         awake, alert, oriented to person, place and time recent and remote memory intact normal attention and concentration language fluent, comprehension intact, naming intact fund of knowledge appropriate  CRANIAL NERVE:  2nd, 3rd, 4th, 6th  - Visual fields full to confrontation, extraocular muscles intact, no nystagmus 5th - facial sensation symmetric 7th - facial strength symmetric 8th - hearing intact 9th - palate elevates symmetrically, uvula midline 11th - shoulder shrug symmetric 12th - tongue protrusion midline  MOTOR:  normal bulk and tone, full strength in the BUE, BLE  SENSORY:  normal and symmetric to light touch  COORDINATION:  finger-nose-finger, fine finger movements normal  REFLEXES:  deep tendon reflexes present and symmetric  GAIT/STATION:  normal   DIAGNOSTIC DATA (LABS, IMAGING, TESTING) - I reviewed patient records, labs, notes, testing and imaging myself where available.  Lab Results  Component Value Date   WBC 9.9 04/18/2017   HGB 13.8 04/18/2017   HCT 41.2 04/18/2017   MCV 85.3 04/18/2017   PLT 166 04/18/2017      Component Value Date/Time   NA 139 02/08/2023 1126   K 4.7 02/08/2023 1126   CL 101 02/08/2023 1126   CO2 25 02/08/2023 1126   GLUCOSE 97 02/08/2023 1126   GLUCOSE 144 (H) 04/18/2017 2345   BUN 15 02/08/2023 1126   CREATININE 0.94 02/08/2023 1126   CALCIUM 10.3 (H) 02/08/2023 1126   PROT 7.1 04/18/2017 2345   ALBUMIN 4.0 04/18/2017 2345   AST 22 04/18/2017 2345   ALT 23 04/18/2017 2345   ALKPHOS 71 04/18/2017 2345   BILITOT 0.4 04/18/2017 2345   GFRNONAA >60 04/18/2017 2345   GFRAA >60 04/18/2017 2345   Lab Results  Component Value Date   CHOL 198 08/16/2013   HDL 34 (L) 08/16/2013   LDLCALC 148 (H) 08/16/2013   LDLDIRECT 196 (H) 03/13/2008   TRIG 81 08/16/2013   CHOLHDL 5.8 08/16/2013   Lab Results  Component Value Date   HGBA1C 5.3 08/16/2013   No results found for: "VITAMINB12" Lab Results  Component Value Date   TSH 1.224 08/16/2013    CT Head 01/03/2023 Age indeterminate lacunar infarcts within the right thalamus and left basal ganglia, new since prior examination as noted on CT arteriogram performed at 6:19 a.m.Marland Kitchen If there is clinical concern  for acute infarction, MRI examination would be helpful for further evaluation.   CTA Head and Neck 01/03/2023:  1. No emergent finding.  2. 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   HgA1C 6.2  LDL 111  ASSESSMENT AND PLAN  37 y.o. year old male with history of hypertension, hyperlipidemia, obesity, prediabetes, previous stroke, right thalamus and left basal ganglia who is presenting after being admitted to the hospital for TIA.  He is currently on aspirin 81 mg daily, will continue current medication. The TIA occurred on April 1 at this time after almost 61-months unclear role of DAPT, so will continue with Aspirin monotherapy.  I also advised him to continue all of his medications.  Will obtain MRI brain for further workup.  I will contact him to go over the result otherwise he will continue to follow-up with his PCP and return as needed.   1. TIA (transient ischemic attack)   2. 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  Orders Placed This Encounter  Procedures   MR BRAIN WO CONTRAST    No orders of the defined types were placed in this encounter.   Return if symptoms worsen or fail to improve.    Windell Norfolk, MD 02/24/2023, 9:52 AM  [facility] [address] [place], [place] [address] [phone]

## 2023-03-10 ENCOUNTER — Ambulatory Visit: Payer: BC Managed Care – PPO | Admitting: Urology

## 2023-03-10 ENCOUNTER — Encounter: Payer: Self-pay | Admitting: Urology

## 2023-03-10 VITALS — BP 110/77 | HR 76 | Ht 64.0 in | Wt 298.0 lb

## 2023-03-10 DIAGNOSIS — R310 Gross hematuria: Secondary | ICD-10-CM

## 2023-03-10 DIAGNOSIS — R3915 Urgency of urination: Secondary | ICD-10-CM

## 2023-03-10 LAB — URINALYSIS, ROUTINE W REFLEX MICROSCOPIC
Bilirubin, UA: NEGATIVE
Glucose, UA: NEGATIVE
Ketones, UA: NEGATIVE
Leukocytes,UA: NEGATIVE
Nitrite, UA: NEGATIVE
Protein,UA: NEGATIVE
RBC, UA: NEGATIVE
Specific Gravity, UA: 1.02 (ref 1.005–1.030)
Urobilinogen, Ur: 2 mg/dL — ABNORMAL HIGH (ref 0.2–1.0)
pH, UA: 6 (ref 5.0–7.5)

## 2023-03-10 LAB — BLADDER SCAN AMB NON-IMAGING: Scan Result: 3

## 2023-03-10 NOTE — Progress Notes (Signed)
Subjective: 1. Urinary urgency   2. Gross hematuria      Consult requested by Colvin Caroli NP.   Jesse Cook is a 37 yo diabetic who is sent for urinary urgency and frequency.  He has been placed on oxybutynin XL 5mg  daily with an improvement in his symptoms.  He had progressive frequency and urgency over the past year.  He had precipitous urgency but no UUI.  HIs frequency is down to q2hrs and nocturia is down from 3-4x to 0-1x.  He has a good stream.  He has some gross hematuria about 8 months ago and then 6 months ago.  He has no history of stones or GU surgery.  He has some intermittent back pain that can move to side to side but no flank pain or nausea.  He had been on topiramate in the past but not recently.   His glucose control is good on Jardiance and Monjouro.   He is a former smoker but none in 18 to 19 years.   He had a TIA in April and he was on Plavix for 2 weeks and now ASA.  His Calcium is 10.3 and his Cr was 0.94 on 02/08/23.  His UA is clear.   ROS:  Review of Systems  Respiratory:  Positive for shortness of breath.   Gastrointestinal:  Positive for heartburn.  Musculoskeletal:  Positive for back pain and joint pain.  Neurological:  Positive for dizziness, weakness and headaches.    Allergies  Allergen Reactions   Metformin And Related Swelling    Swelling of mouth     Past Medical History:  Diagnosis Date   Carpal tunnel syndrome, bilateral upper limbs    Daily headache    "here lately" (08/16/2013)   GERD (gastroesophageal reflux disease)    Hyperlipidemia    Diagnosed 2004   Hypertension    Diagnosed 2000   Lateral femoral cutaneous neuropathy    Sleep apnea    "dx'd; haven't took the sleep study yet" (08/16/2013)   Sleep apnea    Stroke [facility])     Past Surgical History:  Procedure Laterality Date   LYMPH NODE BIOPSY  1990's   "throat"     Social History   Socioeconomic History   Marital status: Married    Spouse name: Not on file   Number of children:  Not on file   Years of education: Not on file   Highest education level: Not on file  Occupational History   Occupation: Sports administrator: [employer]  Tobacco Use   Smoking status: Former    Years: 1    Types: Cigarettes    Quit date: 07/19/2003    Years since quitting: 19.6   Smokeless tobacco: Current    Types: Chew   Tobacco comments:    08/16/2013 "pack of cigarettes lasted me a month; dips 2 cans/day"  Vaping Use   Vaping Use: Some days   Substances: Nicotine  Substance and Sexual Activity   Alcohol use: Yes    Comment: occ   Drug use: No   Sexual activity: Yes  Other Topics Concern   Not on file  Social History Narrative   Pt lives with wife.    Right handed   Caffeine 40oz daily   Social Determinants of Health   Financial Resource Strain: Not on file  Food Insecurity: Not on file  Transportation Needs: Not on file  Physical Activity: Not on file  Stress: Not on file  Social  Connections: Not on file  Intimate Partner Violence: Not on file    Family History  Problem Relation Age of Onset   Diabetes Mother    COPD Mother    Seizures Mother    Heart disease Father 81       1st MI at 63, total 8 stents   Hypertension Father    Diabetes Father    Hyperlipidemia Father    Heart disease Brother 7       2 blockages, no stents as of 2014   Kidney disease Brother     Anti-infectives: Anti-infectives (From admission, onward)    None       Current Outpatient Medications  Medication Sig Dispense Refill   albuterol (VENTOLIN HFA) 108 (90 Base) MCG/ACT inhaler 1 puff as needed Inhalation every 4 hrs     aspirin EC 81 MG tablet Take 1 tablet (81 mg total) by mouth daily.     atorvastatin (LIPITOR) 80 MG tablet Take 1 tablet (80 mg total) by mouth daily at 6 PM. (Patient taking differently: Take 40 mg by mouth daily at 6 PM.) 30 tablet 0   cholecalciferol (VITAMIN D3) 25 MCG (1000 UNIT) tablet Take 1,000 Units by mouth daily.     empagliflozin  (JARDIANCE) 10 MG TABS tablet Take by mouth daily.     ergocalciferol (VITAMIN D2) 1.25 MG (50000 UT) capsule Take 50,000 Units by mouth once a week.     ibuprofen (ADVIL) 800 MG tablet Take 800 mg by mouth every 8 (eight) hours as needed.     lisinopril (ZESTRIL) 10 MG tablet Take 10 mg by mouth daily.     loratadine (CLARITIN REDITABS) 10 MG dissolvable tablet Take 10 mg by mouth daily.     metoprolol tartrate (LOPRESSOR) 100 MG tablet Take 1 tablet (100mg ) TWO hours prior to CT scan 1 tablet 0   Omega 3-6-9 Fatty Acids (OMEGA 3-6-9 COMPLEX) CAPS as directed Orally     oxybutynin (DITROPAN-XL) 5 MG 24 hr tablet Take 5 mg by mouth at bedtime.     tirzepatide [facility]) 2.5 MG/0.5ML Pen Inject 2.5 mg into the skin once a week.     No current facility-administered medications for this visit.     Objective: Vital signs in last 24 hours: BP 110/77   Pulse 76   Ht 5\' 4"  (1.626 m)   Wt 298 lb (135.2 kg)   BMI 51.15 kg/m   Intake/Output from previous day: No intake/output data recorded. Intake/Output this shift: @IOTHISSHIFT @   Physical Exam Vitals reviewed.  Constitutional:      Appearance: Normal appearance.  Cardiovascular:     Rate and Rhythm: Normal rate and regular rhythm.     Heart sounds: Normal heart sounds.  Pulmonary:     Effort: Pulmonary effort is normal. No respiratory distress.     Breath sounds: Normal breath sounds.  Abdominal:     Palpations: Abdomen is soft.     Tenderness: There is no abdominal tenderness.  Musculoskeletal:        General: No swelling. Normal range of motion.  Skin:    General: Skin is warm and dry.  Neurological:     General: No focal deficit present.     Mental Status: He is alert and oriented to person, place, and time.  Psychiatric:        Mood and Affect: Mood normal.        Behavior: Behavior normal.     Lab Results:  Results for orders  placed or performed in visit on 03/10/23 (from the past 24 hour(s))  Urinalysis, Routine  w reflex microscopic     Status: Abnormal   Collection Time: 03/10/23  8:31 AM  Result Value Ref Range   Specific Gravity, UA 1.020 1.005 - 1.030   pH, UA 6.0 5.0 - 7.5   Color, UA Yellow Yellow   Appearance Ur Clear Clear   Leukocytes,UA Negative Negative   Protein,UA Negative Negative/Trace   Glucose, UA Negative Negative   Ketones, UA Negative Negative   RBC, UA Negative Negative   Bilirubin, UA Negative Negative   Urobilinogen, Ur 2.0 (H) 0.2 - 1.0 mg/dL   Nitrite, UA Negative Negative   Microscopic Examination Comment    Narrative   Performed at:  [address] [facility] [address], [place], [place]  [id number] Lab Director: Chinita Pester MT, Phone:  [phone]    BMET No results for input(s): "NA", "K", "CL", "CO2", "GLUCOSE", "BUN", "CREATININE", "CALCIUM" in the last 72 hours. PT/INR No results for input(s): "LABPROT", "INR" in the last 72 hours. ABG No results for input(s): "PHART", "HCO3" in the last 72 hours.  Invalid input(s): "PCO2", "PO2" Recent Results (from the past 2160 hour(s))  Basic metabolic panel     Status: Abnormal   Collection Time: 02/08/23 11:26 AM  Result Value Ref Range   Glucose 97 70 - 99 mg/dL   BUN 15 6 - 20 mg/dL   Creatinine, Ser 6.96 0.76 - 1.27 mg/dL   eGFR 295 >28 UX/LKG/4.01   BUN/Creatinine Ratio 16 9 - 20   Sodium 139 134 - 144 mmol/L   Potassium 4.7 3.5 - 5.2 mmol/L   Chloride 101 96 - 106 mmol/L   CO2 25 20 - 29 mmol/L   Calcium 10.3 (H) 8.7 - 10.2 mg/dL  Urinalysis, Routine w reflex microscopic     Status: Abnormal   Collection Time: 03/10/23  8:31 AM  Result Value Ref Range   Specific Gravity, UA 1.020 1.005 - 1.030   pH, UA 6.0 5.0 - 7.5   Color, UA Yellow Yellow   Appearance Ur Clear Clear   Leukocytes,UA Negative Negative   Protein,UA Negative Negative/Trace   Glucose, UA Negative Negative   Ketones, UA Negative Negative   RBC, UA Negative Negative   Bilirubin, UA Negative Negative   Urobilinogen, Ur 2.0  (H) 0.2 - 1.0 mg/dL   Nitrite, UA Negative Negative   Microscopic Examination Comment     Comment: Microscopic not indicated and not performed.  BLADDER SCAN AMB NON-IMAGING     Status: None   Collection Time: 03/10/23  8:50 AM  Result Value Ref Range   Scan Result 3   UA is clear.  Studies/Results: No results found. CT from 2018 showed no renal issues,.  PCP notes reviewed,.   Assessment/Plan: Urgency and frequency with a history of gross hematuria.   He is doing better on Oxybutynin but with the hematuria history I will get him set up for a CT hematuria study and possible cystoscopy to r/u stones or bladder tumors.   No orders of the defined types were placed in this encounter.    Orders Placed This Encounter  Procedures   CT HEMATURIA WORKUP    Standing Status:   Future    Standing Expiration Date:   06/10/2023    Order Specific Question:   Reason for Exam (SYMPTOM  OR DIAGNOSIS REQUIRED)    Answer:   gross hematuria.    Order Specific Question:  Preferred imaging location?    Answer:   [facility]    Order Specific Question:   Radiology Contrast Protocol - do NOT remove file path    Answer:   [email or web address]   Urinalysis, Routine w reflex microscopic   BLADDER SCAN AMB NON-IMAGING     Return for Next available possible cystoscopy. .    CCColvin Caroli NP     Jesse Cook 03/11/2023

## 2023-03-10 NOTE — Progress Notes (Signed)
post void residual =3ml 

## 2023-03-11 ENCOUNTER — Ambulatory Visit: Payer: BC Managed Care – PPO | Admitting: Gastroenterology

## 2023-03-11 ENCOUNTER — Encounter: Payer: Self-pay | Admitting: Gastroenterology

## 2023-03-11 VITALS — BP 108/80 | HR 68 | Ht 64.0 in | Wt 301.2 lb

## 2023-03-11 DIAGNOSIS — K219 Gastro-esophageal reflux disease without esophagitis: Secondary | ICD-10-CM | POA: Diagnosis not present

## 2023-03-11 MED ORDER — PANTOPRAZOLE SODIUM 40 MG PO TBEC: 40.0000 mg | DELAYED_RELEASE_TABLET | Freq: Every day | ORAL | 3 refills | Status: AC

## 2023-03-11 NOTE — Progress Notes (Signed)
HPI : Jesse Cook is a very pleasant 37 year old male with a history of morbid obesity, stroke/TIA, diabetes, sleep apnea, hypertension and hyperlipidemia who is referred to [place] by Hulan Fess, FNP for further evaluation and management of GERD symptoms. The patient states that since he has been dealing with GERD symptoms for at least 18 years.  The symptoms seem to have increased in frequency and severity over the past few years. His symptoms consist of heartburn/chest discomfort, acid regurgitation, acid burps and episodic nausea and vomiting.  He also complains of waking up with burning and swelling in his throat, hoarseness and throat irritation. He reports having heartburn typically a few days a week.  The acid regurgitation is less frequent, typically 2-3 times per month.  His nausea and vomiting episodes are rare than that.  He has very rare dysphagia.  He is able to eat a meal such as a steak without have anything to drink if needed. He notes that his symptoms have been worsened with certain foods, particularly certain pizzas and alcohol.  He is currently taking over-the-counter Prilosec, which does help reduce the severity and frequency of his symptoms.  Sometimes he will take a second dose in the evening.  Previously had tried Zantac, which did not help at all.  He had been prescribed prescription strength Prilosec, but could not afford it.  His weight has been fluctuating.  He was at 335 pounds at his heaviest, now at 301 pounds.  He is taking Mounjaro to help with weight loss.  He has a history of smoking.  He has no family history of GI malignancy.    Past Medical History:  Diagnosis Date   Carpal tunnel syndrome, bilateral upper limbs    Daily headache    "here lately" (08/16/2013)   DM (diabetes mellitus) (HCC)    GERD (gastroesophageal reflux disease)    Hyperlipidemia    Diagnosed 2004   Hypertension    Diagnosed 2000   Lateral femoral cutaneous neuropathy    Sleep apnea     "dx'd; haven't took the sleep study yet" (08/16/2013)   Sleep apnea    CPAP   Stroke [facility])      Past Surgical History:  Procedure Laterality Date   LYMPH NODE BIOPSY  1990's   "throat"    Family History  Problem Relation Age of Onset   Diabetes Mother    COPD Mother    Seizures Mother    Asthma Mother    Heart failure Mother    Emphysema Mother    GER disease Mother    Heart disease Father 77       1st MI at 28, total 8 stents   Hypertension Father    Diabetes Father    Hyperlipidemia Father    GER disease Father    Heart disease Brother 35       2 blockages, no stents as of 2014   Kidney disease Brother    Diabetes Brother    Stroke Maternal Grandmother    Lung cancer Maternal Grandfather    COPD Maternal Grandfather    Emphysema Maternal Grandfather    Stroke Paternal Grandmother    Diabetes Paternal Grandfather    Social History   Tobacco Use   Smoking status: Former    Years: 1    Types: Cigarettes    Quit date: 07/19/2003    Years since quitting: 19.6   Smokeless tobacco: Current    Types: Chew   Tobacco comments:  08/16/2013 "pack of cigarettes lasted me a month; dips 2 cans/day"  Vaping Use   Vaping Use: Never used  Substance Use Topics   Alcohol use: Yes    Comment: occ   Drug use: No   Current Outpatient Medications  Medication Sig Dispense Refill   albuterol (VENTOLIN HFA) 108 (90 Base) MCG/ACT inhaler 1 puff as needed Inhalation every 4 hrs     aspirin EC 81 MG tablet Take 1 tablet (81 mg total) by mouth daily.     atorvastatin (LIPITOR) 80 MG tablet Take 1 tablet (80 mg total) by mouth daily at 6 PM. (Patient taking differently: Take 40 mg by mouth daily at 6 PM.) 30 tablet 0   empagliflozin (JARDIANCE) 10 MG TABS tablet Take by mouth daily.     ergocalciferol (VITAMIN D2) 1.25 MG (50000 UT) capsule Take 50,000 Units by mouth once a week.     ibuprofen (ADVIL) 800 MG tablet Take 800 mg by mouth every 8 (eight) hours as needed.      lisinopril (ZESTRIL) 10 MG tablet Take 10 mg by mouth daily.     loratadine (CLARITIN REDITABS) 10 MG dissolvable tablet Take 10 mg by mouth daily.     metoprolol tartrate (LOPRESSOR) 100 MG tablet Take 1 tablet (100mg ) TWO hours prior to CT scan 1 tablet 0   Omega 3-6-9 Fatty Acids (OMEGA 3-6-9 COMPLEX) CAPS as directed Orally     omeprazole (PRILOSEC OTC) 20 MG tablet Take 20 mg by mouth 2 (two) times daily as needed.     oxybutynin (DITROPAN-XL) 5 MG 24 hr tablet Take 5 mg by mouth at bedtime.     tirzepatide [facility]) 2.5 MG/0.5ML Pen Inject 2.5 mg into the skin once a week.     No current facility-administered medications for this visit.   Allergies  Allergen Reactions   Metformin And Related Swelling    Swelling of mouth      Review of Systems: All systems reviewed and negative except where noted in HPI.    CT CORONARY MORPH W/CTA COR W/SCORE W/CA W/CM &/OR WO/CM  Addendum Date: 02/20/2023   ADDENDUM REPORT: 02/20/2023 11:36 EXAM: OVER-READ INTERPRETATION  CT CHEST The following report is an over-read performed by radiologist Dr. Curly Shores [facility] on 02/20/2023. This over-read does not include interpretation of cardiac or coronary anatomy or pathology. The coronary CTA interpretation by the cardiologist is attached. COMPARISON:  04/19/2017. FINDINGS: Cardiovascular: Findings discussed in the body of the report. No incidental pulmonary artery filling defects identified to indicate PE. Mediastinum/Nodes: No suspicious adenopathy identified. Imaged mediastinal structures are unremarkable. Lungs/Pleura: Imaged lungs are clear. No pleural effusion or pneumothorax. Upper Abdomen: No acute abnormality. Musculoskeletal: No chest wall abnormality. No acute or significant osseous findings. IMPRESSION: No significant extracardiac incidental findings identified. Electronically Signed   By: Layla Maw M.D.   On: 02/20/2023 11:36   Result Date: 02/20/2023 CLINICAL  DATA:  Chest pain EXAM: Cardiac/Coronary CTA TECHNIQUE: A non-contrast, gated CT scan was obtained with axial slices of 3 mm through the heart for calcium scoring. Calcium scoring was performed using the Agatston method. A 120 kV prospective, gated, contrast cardiac scan was obtained. Gantry rotation speed was 250 msecs and collimation was 0.6 mm. Two sublingual nitroglycerin tablets (0.8 mg) were given. The 3D data set was reconstructed in 5% intervals of the 35-75% of the R-R cycle. Diastolic phases were analyzed on a dedicated workstation using MPR, MIP, and VRT modes. The patient received 95 cc  of contrast. FINDINGS: Image quality: Excellent. Noise artifact is: Limited. Coronary Arteries:  Normal coronary origin.  Left dominance. Left main: The left main is a large caliber vessel with a normal take off from the left coronary cusp that bifurcates to form a left anterior descending artery and a left circumflex artery. There is minimal calcified plaque (<25%). Left anterior descending artery: The LAD contains minimal mixed density plaque (<25%). The LAD gives off 2 patent diagonal branches. Left circumflex artery: The LCX is dominant and patent. There is minimal non-calcified plaque (<25%). The LCX gives off 2 patent obtuse marginal branches. The LCX terminates as a patent PDA. Right coronary artery: The RCA is non-dominant with normal take off from the right coronary cusp. There is no minimal non-calcified plaque (<25%). Right Atrium: Right atrial size is within normal limits. Right Ventricle: The right ventricular cavity is within normal limits. Left Atrium: Left atrial size is normal in size with no left atrial appendage filling defect. Left Ventricle: The ventricular cavity size is within normal limits. Pulmonary arteries: Normal in size. Pulmonary veins: Normal pulmonary venous drainage. Pericardium: Normal thickness without significant effusion or calcium present. Cardiac valves: The aortic valve is  trileaflet without significant calcification. The mitral valve is normal without significant calcification. Aorta: Normal caliber without significant disease. Extra-cardiac findings: See attached radiology report for non-cardiac structures. IMPRESSION: 1. Coronary calcium score of 33.4. This was 89th percentile for age-, sex, and race-matched controls. 2. Total plaque volume 111 mm3 which is 88th percentile for age- and sex-matched controls (calcified plaque 4 mm3; non-calcified plaque 107 mm3). TPV is moderate. 3. Normal coronary origin with left dominance. 4. Minimal CAD (<25%) as described above. RECOMMENDATIONS: 1. Minimal non-obstructive CAD (0-24%). Consider non-atherosclerotic causes of chest pain. Consider preventive therapy and risk factor modification. Lennie Odor, MD Electronically Signed: By: Lennie Odor M.D. On: 02/16/2023 11:06    Physical Exam: BP 108/80 (BP Location: Left Arm, Patient Position: Sitting, Cuff Size: Large)   Pulse 68   Ht 5\' 4"  (1.626 m)   Wt (!) 301 lb 4 oz (136.6 kg)   BMI 51.71 kg/m  Constitutional: Pleasant,well-developed, obese Caucasian male in no acute distress.  Accompanied by his spouse HEENT: Normocephalic and atraumatic. Conjunctivae are normal. No scleral icterus. Neck supple.  Cardiovascular: Normal rate, regular rhythm.  Pulmonary/chest: Effort normal and breath sounds normal. No wheezing, rales or rhonchi. Abdominal: Soft, nondistended, nontender. Bowel sounds active throughout. There are no masses palpable. No hepatomegaly. Neurological: Alert and oriented to person place and time. Skin: Skin is warm and dry. No rashes noted. Psychiatric: Normal mood and affect. Behavior is normal.  CBC    Component Value Date/Time   WBC 9.9 04/18/2017 2345   RBC 4.83 04/18/2017 2345   HGB 13.8 04/18/2017 2345   HCT 41.2 04/18/2017 2345   PLT 166 04/18/2017 2345   MCV 85.3 04/18/2017 2345   MCH 28.6 04/18/2017 2345   MCHC 33.5 04/18/2017 2345   RDW  12.9 04/18/2017 2345   LYMPHSABS 0.9 08/16/2013 0236   MONOABS 0.6 08/16/2013 0236   EOSABS 0.0 08/16/2013 0236   BASOSABS 0.0 08/16/2013 0236    CMP     Component Value Date/Time   NA 139 02/08/2023 1126   K 4.7 02/08/2023 1126   CL 101 02/08/2023 1126   CO2 25 02/08/2023 1126   GLUCOSE 97 02/08/2023 1126   GLUCOSE 144 (H) 04/18/2017 2345   BUN 15 02/08/2023 1126   CREATININE 0.94 02/08/2023 1126  CALCIUM 10.3 (H) 02/08/2023 1126   PROT 7.1 04/18/2017 2345   ALBUMIN 4.0 04/18/2017 2345   AST 22 04/18/2017 2345   ALT 23 04/18/2017 2345   ALKPHOS 71 04/18/2017 2345   BILITOT 0.4 04/18/2017 2345   GFRNONAA >60 04/18/2017 2345   GFRAA >60 04/18/2017 2345       Latest Ref Rng & Units 04/18/2017   11:45 PM 12/23/2015    9:49 PM 08/16/2013    4:35 PM  CBC EXTENDED  WBC 4.0 - 10.5 K/uL 9.9  8.9  7.6   RBC 4.22 - 5.81 MIL/uL 4.83  5.33  5.09   Hemoglobin 13.0 - 17.0 g/dL 65.7  84.6  96.2   HCT 39.0 - 52.0 % 41.2  44.5  44.1   Platelets 150 - 400 K/uL 166  235  164       ASSESSMENT AND PLAN: 37 year old male with longstanding history of typical and atypical GERD symptoms, with partial improvement with low-dose PPI. We discussed the pathophysiology of GERD and the principles of GERD management to include lifestyle modifications  such as dietary discretion (avoidance of alcohol, tobacco, caffeinated and carbonated beverages, spicy/greasy foods, citrus, peppermint/chocolate), weight loss if applicable, head of bed elevation andconsuming last meal of day within 3 hours of bedtime; pharmacologic options to include PPIs, H2RAs and OTC antacids; and finally surgical or endoscopic fundoplication. I recommended the patient try pantoprazole 40 mg once a day.  Hopefully, his insurance will cover this and it will be affordable for him.  He should continue to work on weight loss and the dietary modifications mentioned. No indications for upper endoscopy given lack of red flag symptoms and  response to PPI.  GERD - Protonix 40 mg PO daily - GERD handout  Aqil Goetting E. Tomasa Rand, MD [facility]  I spent a total of 40 minutes reviewing the patient's medical record, interviewing and examining the patient, discussing her diagnosis and management of her condition going forward, and documenting in the medical record   No ref. provider found

## 2023-03-11 NOTE — Patient Instructions (Signed)
_______________________________________________________  If your blood pressure at your visit was 140/90 or greater, please contact your primary care physician to follow up on this.  _______________________________________________________  If you are age 39 or older, your body mass index should be between 23-30. Your Body mass index is 51.71 kg/m. If this is out of the aforementioned range listed, please consider follow up with your Primary Care Provider.  If you are age 31 or younger, your body mass index should be between 19-25. Your Body mass index is 51.71 kg/m. If this is out of the aformentioned range listed, please consider follow up with your Primary Care Provider.    We have sent the following medications to your pharmacy for you to pick up at your convenience: Protonix 40 mg once daily. ________________________________________________________  The [facility] providers would like to encourage you to use [facility] to communicate with providers for non-urgent requests or questions.  Due to long hold times on the telephone, sending your provider a message by [facility] may be a faster and more efficient way to get a response.  Please allow 48 business hours for a response.  Please remember that this is for non-urgent requests.  _______________________________________________________

## 2023-03-15 ENCOUNTER — Ambulatory Visit (HOSPITAL_COMMUNITY): Payer: BC Managed Care – PPO | Attending: Cardiology

## 2023-03-15 DIAGNOSIS — I10 Essential (primary) hypertension: Secondary | ICD-10-CM | POA: Insufficient documentation

## 2023-03-15 DIAGNOSIS — Z6841 Body Mass Index (BMI) 40.0 and over, adult: Secondary | ICD-10-CM | POA: Insufficient documentation

## 2023-03-15 DIAGNOSIS — R079 Chest pain, unspecified: Secondary | ICD-10-CM | POA: Insufficient documentation

## 2023-03-15 DIAGNOSIS — Z8249 Family history of ischemic heart disease and other diseases of the circulatory system: Secondary | ICD-10-CM | POA: Diagnosis present

## 2023-03-15 DIAGNOSIS — G4733 Obstructive sleep apnea (adult) (pediatric): Secondary | ICD-10-CM | POA: Diagnosis present

## 2023-03-15 DIAGNOSIS — R072 Precordial pain: Secondary | ICD-10-CM | POA: Diagnosis present

## 2023-03-15 LAB — ECHOCARDIOGRAM COMPLETE
Area-P 1/2: 4.06 cm2
S' Lateral: 3.2 cm

## 2023-03-15 MED ORDER — PERFLUTREN LIPID MICROSPHERE
1.0000 mL | INTRAVENOUS | Status: AC | PRN
Start: 2023-03-15 — End: 2023-03-15
  Administered 2023-03-15: 2 mL via INTRAVENOUS

## 2023-03-16 ENCOUNTER — Ambulatory Visit
Admission: RE | Admit: 2023-03-16 | Discharge: 2023-03-16 | Disposition: A | Discharge status: Home / Self Care | Payer: BC Managed Care – PPO | Source: Ambulatory Visit | Attending: Neurology | Admitting: Neurology

## 2023-03-16 DIAGNOSIS — G459 Transient cerebral ischemic attack, unspecified: Secondary | ICD-10-CM

## 2023-03-30 ENCOUNTER — Other Ambulatory Visit: Payer: Self-pay | Admitting: Urology

## 2023-03-30 ENCOUNTER — Telehealth: Payer: Self-pay

## 2023-03-30 DIAGNOSIS — R3915 Urgency of urination: Secondary | ICD-10-CM

## 2023-03-30 MED ORDER — MIRABEGRON ER 25 MG PO TB24: 25.0000 mg | ORAL_TABLET | Freq: Every day | ORAL | 11 refills | Status: DC

## 2023-03-30 NOTE — Telephone Encounter (Signed)
Confirmed with patient rx is oxybutynin.  Do you have an alternative?

## 2023-03-30 NOTE — Telephone Encounter (Signed)
Patient wanted to make Dr. Annabell Howells aware medication is causing dry mouth.  He will need to have it changed. Was not sure of name of medication.  Please advise.

## 2023-03-31 NOTE — Telephone Encounter (Signed)
FYI patient does have hypertension, it is controlled.  He is aware of your response and will monitor his BP while taking rx.

## 2023-04-14 ENCOUNTER — Other Ambulatory Visit: Payer: BC Managed Care – PPO | Admitting: Urology

## 2023-04-22 ENCOUNTER — Other Ambulatory Visit: Payer: BC Managed Care – PPO

## 2023-05-13 ENCOUNTER — Encounter: Payer: Self-pay | Admitting: Urology

## 2023-05-25 ENCOUNTER — Ambulatory Visit: Payer: BC Managed Care – PPO | Admitting: Cardiovascular Disease

## 2023-06-02 ENCOUNTER — Ambulatory Visit
Admission: RE | Admit: 2023-06-02 | Discharge: 2023-06-02 | Disposition: A | Discharge status: Home / Self Care | Payer: BC Managed Care – PPO | Source: Ambulatory Visit | Attending: Urology | Admitting: Urology

## 2023-06-02 DIAGNOSIS — R3915 Urgency of urination: Secondary | ICD-10-CM

## 2023-06-02 DIAGNOSIS — R310 Gross hematuria: Secondary | ICD-10-CM

## 2023-06-02 MED ORDER — IOPAMIDOL (ISOVUE-300) INJECTION 61%
200.0000 mL | Freq: Once | INTRAVENOUS | Status: AC | PRN
  Administered 2023-06-02: 150 mL via INTRAVENOUS

## 2023-06-09 ENCOUNTER — Encounter: Payer: Self-pay | Admitting: Urology

## 2023-06-09 ENCOUNTER — Ambulatory Visit (INDEPENDENT_AMBULATORY_CARE_PROVIDER_SITE_OTHER): Payer: BC Managed Care – PPO | Admitting: Urology

## 2023-06-09 VITALS — BP 107/73 | HR 71

## 2023-06-09 DIAGNOSIS — N200 Calculus of kidney: Secondary | ICD-10-CM

## 2023-06-09 DIAGNOSIS — N281 Cyst of kidney, acquired: Secondary | ICD-10-CM | POA: Diagnosis not present

## 2023-06-09 DIAGNOSIS — R310 Gross hematuria: Secondary | ICD-10-CM | POA: Diagnosis not present

## 2023-06-09 DIAGNOSIS — R3915 Urgency of urination: Secondary | ICD-10-CM

## 2023-06-09 LAB — URINALYSIS, ROUTINE W REFLEX MICROSCOPIC
Bilirubin, UA: NEGATIVE
Ketones, UA: NEGATIVE
Leukocytes,UA: NEGATIVE
Nitrite, UA: NEGATIVE
Protein,UA: NEGATIVE
RBC, UA: NEGATIVE
Specific Gravity, UA: 1.025 (ref 1.005–1.030)
Urobilinogen, Ur: 1 mg/dL (ref 0.2–1.0)
pH, UA: 6 (ref 5.0–7.5)

## 2023-06-09 MED ORDER — SOLIFENACIN SUCCINATE 5 MG PO TABS
5.0000 mg | ORAL_TABLET | Freq: Every day | ORAL | 11 refills | Status: DC
Start: 2023-06-09 — End: 2024-02-16

## 2023-06-09 MED ORDER — CIPROFLOXACIN HCL 500 MG PO TABS
500.0000 mg | ORAL_TABLET | Freq: Once | ORAL | Status: AC
Start: 2023-06-09 — End: 2023-06-09
  Administered 2023-06-09: 500 mg via ORAL

## 2023-06-09 NOTE — Progress Notes (Signed)
Subjective: 1. Gross hematuria   2. Urinary urgency   3. Renal stones   4. Renal cyst      Consult requested by Colvin Caroli NP.   06/09/23: Jesse Cook returns in f/u.  He had a CT on 06/02/23 that showed a small non-obstructing RUP stone (that was not noted in the report) and a small right renal cyst but no clear finding to explain the hematuria.  His IPSS today is 12 with frequency, urgency with nocturia 2-3x.   He got dry mouth with oxybutynin but didn't respond to the Myrbetriq.    GU hx: Jesse Cook is a 37 yo diabetic who is sent for urinary urgency and frequency.  He has been placed on oxybutynin XL 5mg  daily with an improvement in his symptoms.  He had progressive frequency and urgency over the past year.  He had precipitous urgency but no UUI.  HIs frequency is down to q2hrs and nocturia is down from 3-4x to 0-1x.  He has a good stream.  He has some gross hematuria about 8 months ago and then 6 months ago.  He has no history of stones or GU surgery.  He has some intermittent back pain that can move to side to side but no flank pain or nausea.  He had been on topiramate in the past but not recently.   His glucose control is good on Jardiance and Monjouro.   He is a former smoker but none in 18 to 19 years.   He had a TIA in April and he was on Plavix for 2 weeks and now ASA.  His Calcium is 10.3 and his Cr was 0.94 on 02/08/23.  His UA is clear.   ROS:  Review of Systems  All other systems reviewed and are negative.   Allergies  Allergen Reactions   Metformin And Related Swelling    Swelling of mouth     Past Medical History:  Diagnosis Date   Carpal tunnel syndrome, bilateral upper limbs    Daily headache    "here lately" (08/16/2013)   DM (diabetes mellitus) (HCC)    GERD (gastroesophageal reflux disease)    Hyperlipidemia    Diagnosed 2004   Hypertension    Diagnosed 2000   Lateral femoral cutaneous neuropathy    Sleep apnea    "dx'd; haven't took the sleep study yet" (08/16/2013)    Sleep apnea    CPAP   Stroke [facility])     Past Surgical History:  Procedure Laterality Date   LYMPH NODE BIOPSY  1990's   "throat"     Social History   Socioeconomic History   Marital status: Married    Spouse name: Not on file   Number of children: 0   Years of education: Not on file   Highest education level: Not on file  Occupational History   Occupation: Sports administrator: [employer]  Tobacco Use   Smoking status: Former    Current packs/day: 0.00    Types: Cigarettes    Start date: 07/18/2002    Quit date: 07/19/2003    Years since quitting: 19.9   Smokeless tobacco: Current    Types: Chew   Tobacco comments:    08/16/2013 "pack of cigarettes lasted me a month; dips 2 cans/day"  Vaping Use   Vaping status: Never Used  Substance and Sexual Activity   Alcohol use: Yes    Comment: occ   Drug use: No   Sexual activity: Yes  Other Topics  Concern   Not on file  Social History Narrative   Pt lives with wife.    Right handed   Caffeine 40oz daily   Social Determinants of Health   Financial Resource Strain: Low Risk  (01/03/2023)   Received from [facility], [facility]   Overall Financial Resource Strain (CARDIA)    Difficulty of Paying Living Expenses: Not hard at all  Food Insecurity: No Food Insecurity (01/03/2023)   Received from [facility], [facility]   Hunger Vital Sign    Worried About Running Out of Food in the Last Year: Never true    Ran Out of Food in the Last Year: Never true  Transportation Needs: No Transportation Needs (01/03/2023)   Received from [facility], [facility]   [facility] - Transportation    Lack of Transportation (Medical): No    Lack of Transportation (Non-Medical): No  Physical Activity: Inactive (01/03/2023)   Received from [facility], [facility]   Exercise Vital Sign    Days of Exercise per Week: 0 days    Minutes of Exercise per Session: 0 min  Stress: No Stress Concern Present (01/03/2023)    Received from [facility], [facility] - Occupational Stress Questionnaire    Feeling of Stress : Not at all  Social Connections: Unknown (01/03/2023)   Received from [facility], [facility]   Social Network    Social Network: Not on file  Intimate Partner Violence: Unknown (01/03/2023)   Received from [facility], [facility]   HITS    Physically Hurt: Not on file    Insult or Talk Down To: Not on file    Threaten Physical Harm: Not on file    Scream or Curse: Not on file    Family History  Problem Relation Age of Onset   Diabetes Mother    COPD Mother    Seizures Mother    Asthma Mother    Heart failure Mother    Emphysema Mother    GER disease Mother    Heart disease Father 33       1st MI at 64, total 8 stents   Hypertension Father    Diabetes Father    Hyperlipidemia Father    GER disease Father    Heart disease Brother 7       2 blockages, no stents as of 2014   Kidney disease Brother    Diabetes Brother    Stroke Maternal Grandmother    Lung cancer Maternal Grandfather    COPD Maternal Grandfather    Emphysema Maternal Grandfather    Stroke Paternal Grandmother    Diabetes Paternal Grandfather     Anti-infectives: Anti-infectives (From admission, onward)    Start     Dose/Rate Route Frequency Ordered Stop   06/09/23 1430  CIPROFLOXACIN HCL 500 MG PO TABS        500 mg Oral  Once 06/09/23 1418 06/09/23 1430       Current Outpatient Medications  Medication Sig Dispense Refill   albuterol (VENTOLIN HFA) 108 (90 Base) MCG/ACT inhaler 1 puff as needed Inhalation every 4 hrs     aspirin EC 81 MG tablet Take 1 tablet (81 mg total) by mouth daily.     atorvastatin (LIPITOR) 80 MG tablet Take 1 tablet (80 mg total) by mouth daily at 6 PM. (Patient taking differently: Take 40 mg by mouth daily at  6 PM.) 30 tablet 0   empagliflozin (JARDIANCE) 10 MG TABS tablet Take by mouth daily.     ergocalciferol  (VITAMIN D2) 1.25 MG (50000 UT) capsule Take 50,000 Units by mouth once a week.     ibuprofen (ADVIL) 800 MG tablet Take 800 mg by mouth every 8 (eight) hours as needed.     lisinopril (ZESTRIL) 10 MG tablet Take 10 mg by mouth daily.     loratadine (CLARITIN REDITABS) 10 MG dissolvable tablet Take 10 mg by mouth daily.     metoprolol tartrate (LOPRESSOR) 100 MG tablet Take 1 tablet (100mg ) TWO hours prior to CT scan 1 tablet 0   Omega 3-6-9 Fatty Acids (OMEGA 3-6-9 COMPLEX) CAPS as directed Orally     omeprazole (PRILOSEC OTC) 20 MG tablet Take 20 mg by mouth 2 (two) times daily as needed.     pantoprazole (PROTONIX) 40 MG tablet Take 1 tablet (40 mg total) by mouth daily. 90 tablet 3   solifenacin (VESICARE) 5 MG tablet Take 1 tablet (5 mg total) by mouth daily. 30 tablet 11   tirzepatide (MOUNJARO) 2.5 MG/0.5ML Pen Inject 2.5 mg into the skin once a week. (Patient not taking: Reported on 06/09/2023)     No current facility-administered medications for this visit.     Objective: Vital signs in last 24 hours: BP 107/73   Pulse 71   Intake/Output from previous day: No intake/output data recorded. Intake/Output this shift: @IOTHISSHIFT @   Physical Exam  Lab Results:  Results for orders placed or performed in visit on 06/09/23 (from the past 24 hour(s))  Urinalysis, Routine w reflex microscopic     Status: Abnormal   Collection Time: 06/09/23  2:02 PM  Result Value Ref Range   Specific Gravity, UA 1.025 1.005 - 1.030   pH, UA 6.0 5.0 - 7.5   Color, UA Yellow Yellow   Appearance Ur Clear Clear   Leukocytes,UA Negative Negative   Protein,UA Negative Negative/Trace   Glucose, UA 3+ (A) Negative   Ketones, UA Negative Negative   RBC, UA Negative Negative   Bilirubin, UA Negative Negative   Urobilinogen, Ur 1.0 0.2 - 1.0 mg/dL   Nitrite, UA Negative Negative   Microscopic Examination Comment    Narrative   Performed at:  [address] [facility] [address],  [place], [place]  [id number] Lab Director: Chinita Pester MT, Phone:  [phone]     BMET No results for input(s): "NA", "K", "CL", "CO2", "GLUCOSE", "BUN", "CREATININE", "CALCIUM" in the last 72 hours. PT/INR No results for input(s): "LABPROT", "INR" in the last 72 hours. ABG No results for input(s): "PHART", "HCO3" in the last 72 hours.  Invalid input(s): "PCO2", "PO2" Recent Results (from the past 2160 hour(s))  ECHOCARDIOGRAM COMPLETE     Status: None   Collection Time: 03/15/23  9:39 AM  Result Value Ref Range   Area-P 1/2 4.06 cm2   S' Lateral 3.20 cm   Est EF 60 - 65%   Urinalysis, Routine w reflex microscopic     Status: Abnormal   Collection Time: 06/09/23  2:02 PM  Result Value Ref Range   Specific Gravity, UA 1.025 1.005 - 1.030   pH, UA 6.0 5.0 - 7.5   Color, UA Yellow Yellow   Appearance Ur Clear Clear   Leukocytes,UA Negative Negative   Protein,UA Negative Negative/Trace   Glucose, UA 3+ (A) Negative   Ketones, UA Negative Negative   RBC, UA Negative Negative   Bilirubin, UA  Negative Negative   Urobilinogen, Ur 1.0 0.2 - 1.0 mg/dL   Nitrite, UA Negative Negative   Microscopic Examination Comment     Comment: Microscopic not indicated and not performed.  UA is clear.  Studies/Results: No results found. CT HEMATURIA WORKUP  Result Date: 06/04/2023 CLINICAL DATA:  Gross hematuria, back pain, blood in stool EXAM: CT ABDOMEN AND PELVIS WITHOUT AND WITH CONTRAST TECHNIQUE: Multidetector CT imaging of the abdomen and pelvis was performed following the standard protocol before and following the bolus administration of intravenous contrast. RADIATION DOSE REDUCTION: This exam was performed according to the departmental dose-optimization program which includes automated exposure control, adjustment of the mA and/or kV according to patient size and/or use of iterative reconstruction technique. CONTRAST:  ISOVUE-300 IOPAMIDOL (ISOVUE-300) INJECTION 61% COMPARISON:  CT  abdomen/pelvis dated 04/19/2017 FINDINGS: Lower chest: Lung bases are clear. Hepatobiliary: Mild hepatic steatosis. Gallbladder is unremarkable. No intrahepatic or extrahepatic ductal dilatation. Pancreas: Within normal limits. Spleen: Within normal limits. Adrenals/Urinary Tract: Adrenal glands are within normal limits. 11 mm lateral right lower pole renal cyst (series 7/image 44), measuring simple fluid density, benign (Bosniak I). Additional 11 mm anterior interpolar right renal cyst (series 7/image 40), measuring, fluid density, benign (Bosniak I). No follow-up is recommended. Left kidney is within normal limits. No renal, ureteral, or bladder calculi.  Hydronephrosis. On delayed imaging, there are no filling defects in the bilateral opacified proximal collecting systems, ureters, or bladder. Bladder is underdistended but unremarkable. Stomach/Bowel: Stomach is within normal limits. No evidence of bowel obstruction. Normal appendix (series 7/image 50). No colonic wall thickening or inflammatory changes. Vascular/Lymphatic: No evidence of abdominal aortic aneurysm. No suspicious abdominopelvic lymphadenopathy. Reproductive: Prostate is unremarkable. Other: No abdominopelvic ascites. Musculoskeletal: Mild degenerative changes of the lower thoracic spine. IMPRESSION: No findings to account for the patient's hematuria. Mild hepatic steatosis. Electronically Signed   By: Charline Bills M.D.   On: 06/04/2023 19:41   ECHOCARDIOGRAM COMPLETE  Result Date: 03/15/2023    ECHOCARDIOGRAM REPORT   Patient Name:   Jesse Cook   Date of Exam: 03/15/2023 Medical Rec #:  [id number]     Height:       64.0 in Accession #:    [id number]    Weight:       301.2 lb Date of Birth:  09-29-1986    BSA:          2.328 m Patient Age:    36 years      BP:           108/80 mmHg Patient Gender: M             HR:           55 bpm. Exam Location:  [address] Procedure: 2D Echo, Cardiac Doppler, Color Doppler and Intracardiac             Opacification Agent Indications:    Hypertension I10; R07.9* Chest pain, unspecified;  History:        Patient has prior history of Echocardiogram examinations, most                 recent 08/17/2013. Risk Factors:Hypertension, Dyslipidemia and                 Diabetes.  Sonographer:    Thurman Coyer RDCS Referring Phys: [id number] [facility] J BERRY  Sonographer Comments: Technically difficult study due to poor echo windows. IMPRESSIONS  1. Left ventricular ejection fraction, by estimation, is 60 to 65%. The  left ventricle has normal function. The left ventricle has no regional wall motion abnormalities. Left ventricular diastolic parameters were normal.  2. Right ventricular systolic function is normal. The right ventricular size is normal.  3. The mitral valve is normal in structure. No evidence of mitral valve regurgitation. No evidence of mitral stenosis.  4. The aortic valve is normal in structure. Aortic valve regurgitation is not visualized. No aortic stenosis is present.  5. The inferior vena cava is normal in size with greater than 50% respiratory variability, suggesting right atrial pressure of 3 mmHg. FINDINGS  Left Ventricle: Left ventricular ejection fraction, by estimation, is 60 to 65%. The left ventricle has normal function. The left ventricle has no regional wall motion abnormalities. Definity contrast agent was given IV to delineate the left ventricular  endocardial borders. The left ventricular internal cavity size was normal in size. There is no left ventricular hypertrophy. Left ventricular diastolic parameters were normal. Right Ventricle: The right ventricular size is normal. No increase in right ventricular wall thickness. Right ventricular systolic function is normal. Left Atrium: Left atrial size was normal in size. Right Atrium: Right atrial size was normal in size. Pericardium: There is no evidence of pericardial effusion. Mitral Valve: The mitral valve is normal in structure. No evidence of  mitral valve regurgitation. No evidence of mitral valve stenosis. Tricuspid Valve: The tricuspid valve is normal in structure. Tricuspid valve regurgitation is trivial. No evidence of tricuspid stenosis. Aortic Valve: The aortic valve is normal in structure. Aortic valve regurgitation is not visualized. No aortic stenosis is present. Pulmonic Valve: The pulmonic valve was normal in structure. Pulmonic valve regurgitation is not visualized. No evidence of pulmonic stenosis. Aorta: The aortic root is normal in size and structure. Venous: The inferior vena cava is normal in size with greater than 50% respiratory variability, suggesting right atrial pressure of 3 mmHg. IAS/Shunts: No atrial level shunt detected by color flow Doppler.  LEFT VENTRICLE PLAX 2D LVIDd:         5.00 cm   Diastology LVIDs:         3.20 cm   LV e' medial:    8.27 cm/s LV PW:         1.00 cm   LV E/e' medial:  10.9 LV IVS:        0.80 cm   LV e' lateral:   12.10 cm/s LVOT diam:     2.30 cm   LV E/e' lateral: 7.4 LV SV:         84 LV SV Index:   36 LVOT Area:     4.15 cm  RIGHT VENTRICLE RV Basal diam:  3.40 cm RV Mid diam:    3.60 cm RV S prime:     12.80 cm/s TAPSE (M-mode): 1.9 cm LEFT ATRIUM             Index        RIGHT ATRIUM           Index LA diam:        3.90 cm 1.67 cm/m   RA Area:     11.90 cm LA Vol (A2C):   54.9 ml 23.58 ml/m  RA Volume:   27.10 ml  11.64 ml/m LA Vol (A4C):   35.0 ml 15.03 ml/m LA Biplane Vol: 44.7 ml 19.20 ml/m  AORTIC VALVE LVOT Vmax:   98.50 cm/s LVOT Vmean:  59.700 cm/s LVOT VTI:    0.203 m  AORTA Ao Root diam: 3.40  cm Ao Asc diam:  3.50 cm MITRAL VALVE MV Area (PHT): 4.06 cm    SHUNTS MV Decel Time: 187 msec    Systemic VTI:  0.20 m MV E velocity: 90.10 cm/s  Systemic Diam: 2.30 cm MV A velocity: 60.30 cm/s MV E/A ratio:  1.49 Arvilla Meres MD Electronically signed by Arvilla Meres MD Signature Date/Time: 03/15/2023/10:25:55 AM    Final     Procedure: Flexible cystoscopy  He was prepped with  betadine and 2% lidocaine jelly and give cipro 500mg  po.  The urethra was normal.  The external sphincter was intact.  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  The bladder had mild trabeculation with no mucosal lesions and the UO's were normal.   Assessment/Plan: Urgency and frequency with a history of gross hematuria.  The hematuria w/u is negative.  He could have passed a small stone since there is one in the right kidney.   I will try him on vesicare 5mg  to see if that will help his OAB symptoms with less dry mouth than the oxybutynin.   He will return in 3 months.   Right renal stone. This is small and doesn't need treatment.  Renal cyst.  Small and benign  Meds ordered this encounter  Medications   ciprofloxacin (CIPRO) tablet 500 mg   solifenacin (VESICARE) 5 MG tablet    Sig: Take 1 tablet (5 mg total) by mouth daily.    Dispense:  30 tablet    Refill:  11     Orders Placed This Encounter  Procedures   Urinalysis, Routine w reflex microscopic   Cystoscopy     Return in about 3 months (around 09/08/2023).    CC: Colvin Caroli NP     Jesse Cook 06/10/2023

## 2023-08-23 ENCOUNTER — Encounter: Payer: Self-pay | Admitting: Cardiovascular Disease

## 2023-08-23 ENCOUNTER — Ambulatory Visit: Payer: BC Managed Care – PPO | Attending: Cardiovascular Disease | Admitting: Cardiovascular Disease

## 2023-08-23 ENCOUNTER — Telehealth: Payer: Self-pay

## 2023-08-23 ENCOUNTER — Telehealth: Payer: Self-pay | Admitting: Pharmacist

## 2023-08-23 ENCOUNTER — Other Ambulatory Visit (HOSPITAL_COMMUNITY): Payer: Self-pay

## 2023-08-23 VITALS — BP 118/78 | HR 54 | Ht 64.0 in | Wt 285.0 lb

## 2023-08-23 DIAGNOSIS — Z8249 Family history of ischemic heart disease and other diseases of the circulatory system: Secondary | ICD-10-CM

## 2023-08-23 DIAGNOSIS — R079 Chest pain, unspecified: Secondary | ICD-10-CM

## 2023-08-23 DIAGNOSIS — E66813 Obesity, class 3: Secondary | ICD-10-CM | POA: Diagnosis not present

## 2023-08-23 DIAGNOSIS — E782 Mixed hyperlipidemia: Secondary | ICD-10-CM

## 2023-08-23 DIAGNOSIS — I10 Essential (primary) hypertension: Secondary | ICD-10-CM | POA: Diagnosis not present

## 2023-08-23 DIAGNOSIS — G4733 Obstructive sleep apnea (adult) (pediatric): Secondary | ICD-10-CM | POA: Diagnosis not present

## 2023-08-23 DIAGNOSIS — Z6841 Body Mass Index (BMI) 40.0 and over, adult: Secondary | ICD-10-CM

## 2023-08-23 NOTE — Patient Instructions (Signed)

## 2023-08-23 NOTE — Telephone Encounter (Signed)
Please complete PA for [facility] 

## 2023-08-23 NOTE — Telephone Encounter (Signed)
PA request has been Submitted. New Encounter created for follow up. For additional info see Pharmacy Prior Auth telephone encounter from 08/23/23.

## 2023-08-23 NOTE — Assessment & Plan Note (Signed)
History of chest pain in the past with coronary CTA performed 02/16/2023 revealing a coronary calcium score of 33 with minimal nonobstructive CAD.  He has not had any recurrent symptoms.

## 2023-08-23 NOTE — Assessment & Plan Note (Signed)
History of essential hypertension with blood pressure measured today at 142/98.  He is on lisinopril.  He took his medications prior to coming to the office today.  He checks blood pressure at home and usually is much better than this.  Will recheck his blood pressure.

## 2023-08-23 NOTE — Assessment & Plan Note (Signed)
His father, Demar Fritchie thank you, had his first MI at age 39.

## 2023-08-23 NOTE — Progress Notes (Signed)
08/23/2023 Duilio Stetler [facility]   [birth year]  [id number]  Primary Physician Bucio, Julian Reil, FNP Primary Cardiologist: Runell Gess MD Nicholes Calamity, [place]  HPI:  Jesse Cook is a 37 y.o.  morbidly overweight married Caucasian male with no children who was referred to me by the emergency room at [facility] after recently being seen on 01/04/2023 for chest pain. 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 His risk factors include treated hypertension, diabetes and hyperlipidemia. He does have family history of a father who had coronary intervention 30 years ago in his late 52s. He has never had a heart attack but had question TIA when he was seen recently with CT abnormalities. He had chest pain which is fairly infrequent and he will his blood pressure was under better control over the last year. He was seen at [facility] on 4/2 with chest pain, left facial and upper extremity numbness. He ruled out for myocardial infarction. CT scan did show although lacunar infarcts.  Since I saw him 6 months ago I did get a coronary CTA on him 02/16/2023 revealing a coronary calcium score of 33 with minimal nonobstructive CAD.  He has not had any recurrent chest pain.  He has tried to lose weight on GLP-1 agonist unsuccessfully.   Current Meds  Medication Sig   albuterol (VENTOLIN HFA) 108 (90 Base) MCG/ACT inhaler 1 puff as needed Inhalation every 4 hrs   aspirin EC 81 MG tablet Take 1 tablet (81 mg total) by mouth daily.   atorvastatin (LIPITOR) 80 MG tablet Take 1 tablet (80 mg total) by mouth daily at 6 PM. (Patient taking differently: Take 40 mg by mouth daily at 6 PM.)   empagliflozin (JARDIANCE) 10 MG TABS tablet Take by mouth daily.   ergocalciferol (VITAMIN D2) 1.25 MG (50000 UT) capsule Take 50,000 Units by mouth once a week.   ibuprofen  (ADVIL) 800 MG tablet Take 800 mg by mouth every 8 (eight) hours as needed.   lisinopril (ZESTRIL) 10 MG tablet Take 10 mg by mouth daily.   loratadine (CLARITIN REDITABS) 10 MG dissolvable tablet Take 10 mg by mouth daily.   metoprolol tartrate (LOPRESSOR) 100 MG tablet Take 1 tablet (100mg ) TWO hours prior to CT scan   Omega 3-6-9 Fatty Acids (OMEGA 3-6-9 COMPLEX) CAPS as directed Orally   pantoprazole (PROTONIX) 40 MG tablet Take 1 tablet (40 mg total) by mouth daily.     Allergies  Allergen Reactions   Metformin And Related Swelling    Swelling of mouth     Social History   Socioeconomic History   Marital status: Married    Spouse name: Not on file   Number of children: 0   Years of education: Not on file   Highest education level: Not on file  Occupational History   Occupation: Sports administrator: [employer]  Tobacco Use   Smoking status: Former    Current packs/day: 0.00    Types: Cigarettes    Start date: 07/18/2002    Quit date: 07/19/2003    Years since quitting: 20.1   Smokeless tobacco: Current    Types: Chew   Tobacco comments:    08/16/2013 "pack of cigarettes lasted me a month; dips 2 cans/day"  Vaping Use  Vaping status: Never Used  Substance and Sexual Activity   Alcohol use: Yes    Comment: occ   Drug use: No   Sexual activity: Yes  Other Topics Concern   Not on file  Social History Narrative   Pt lives with wife.    Right handed   Caffeine 40oz daily   Social Determinants of Health   Financial Resource Strain: Low Risk  (01/03/2023)   Received from [facility], [facility]   Overall Financial Resource Strain (CARDIA)    Difficulty of Paying Living Expenses: Not hard at all  Food Insecurity: No Food Insecurity (01/03/2023)   Received from [facility], [facility]   Hunger Vital Sign    Worried About Running Out of Food in the Last Year: Never true    Ran Out of Food in the Last Year: Never true  Transportation Needs: No  Transportation Needs (01/03/2023)   Received from [facility], [facility]   [facility] - Transportation    Lack of Transportation (Medical): No    Lack of Transportation (Non-Medical): No  Physical Activity: Inactive (01/03/2023)   Received from [facility], [facility]   Exercise Vital Sign    Days of Exercise per Week: 0 days    Minutes of Exercise per Session: 0 min  Stress: No Stress Concern Present (01/03/2023)   Received from [facility], [facility] of Occupational Health - Occupational Stress Questionnaire    Feeling of Stress : Not at all  Social Connections: Unknown (01/03/2023)   Received from [facility], [facility]   Social Network    Social Network: Not on file  Intimate Partner Violence: Unknown (01/03/2023)   Received from [facility], [facility]   HITS    Physically Hurt: Not on file    Insult or Talk Down To: Not on file    Threaten Physical Harm: Not on file    Scream or Curse: Not on file     Review of Systems: General: negative for chills, fever, night sweats or weight changes.  Cardiovascular: negative for chest pain, dyspnea on exertion, edema, orthopnea, palpitations, paroxysmal nocturnal dyspnea or shortness of breath Dermatological: negative for rash Respiratory: negative for cough or wheezing Urologic: negative for hematuria Abdominal: negative for nausea, vomiting, diarrhea, bright red blood per rectum, melena, or hematemesis Neurologic: negative for visual changes, syncope, or dizziness All other systems reviewed and are otherwise negative except as noted above.    Blood pressure (!) 142/98, pulse (!) 54, height 5\' 4"  (1.626 m), weight 285 lb (129.3 kg), SpO2 98%.  General appearance: alert and no distress Neck: no adenopathy, no carotid bruit, no JVD, supple, symmetrical, trachea midline, and thyroid not enlarged, symmetric, no tenderness/mass/nodules Lungs: clear to auscultation bilaterally Heart:  regular rate and rhythm, S1, S2 normal, no murmur, click, rub or gallop Extremities: extremities normal, atraumatic, no cyanosis or edema Pulses: 2+ and symmetric Skin: Skin color, texture, turgor normal. No rashes or lesions Neurologic: Grossly normal  EKG EKG Interpretation Date/Time:  Tuesday August 23 2023 09:41:11 EST Ventricular Rate:  54 PR Interval:  166 QRS Duration:  94 QT Interval:  382 QTC Calculation: 362 R Axis:   -16  Text Interpretation: Sinus bradycardia Inferior infarct , age undetermined When compared with ECG of 18-Apr-2017 23:44, Vent. rate has decreased BY  49 BPM Inferior infarct is now Present QT has shortened Confirmed by Nanetta Batty [phone]) on  08/23/2023 9:50:02 AM    ASSESSMENT AND PLAN:   HYPERLIPIDEMIA History of hyperlipidemia on high-dose atorvastatin with lipid profile performed/2/24 revealing a total cholesterol 170, LDL of 111 and HDL of 5033.  He is not at goal for secondary prevention given his mildly elevated coronary calcium score.  We will explore starting a PCSK9 such as Repatha.  LDL goal less than 70.  OBESITY, NOS History obesity with a BMI of 49.  He was started on 2 GLP-1 agonist which she did not tolerate.  He is lost 18 pounds since I last saw him.  HYPERTENSION, BENIGN SYSTEMIC History of essential hypertension with blood pressure measured today at 142/98.  He is on lisinopril.  He took his medications prior to coming to the office today.  He checks blood pressure at home and usually is much better than this.  Will recheck his blood pressure.  Obstructive sleep apnea History of obstructive sleep apnea on CPAP  Family history of heart disease His father, Gregroy Lahaie thank you, had his first MI at age 78.  Chest pain of uncertain etiology History of chest pain in the past with coronary CTA performed 02/16/2023 revealing a coronary calcium score of 33 with minimal nonobstructive CAD.  He has not had any recurrent  symptoms.     Runell Gess MD FACP,FACC,FAHA, [facility] 08/23/2023 10:02 AM

## 2023-08-23 NOTE — Assessment & Plan Note (Signed)
History of hyperlipidemia on high-dose atorvastatin with lipid profile performed/2/24 revealing a total cholesterol 170, LDL of 111 and HDL of 5033.  He is not at goal for secondary prevention given his mildly elevated coronary calcium score.  We will explore starting a PCSK9 such as Repatha.  LDL goal less than 70.

## 2023-08-23 NOTE — Assessment & Plan Note (Signed)
History of obstructive sleep apnea on CPAP. 

## 2023-08-23 NOTE — Telephone Encounter (Signed)
Pharmacy Patient Advocate Encounter   Received notification from Physician's Office that prior authorization for REPATHA is required/requested.   Insurance verification completed.   The patient is insured through [facility] .   Per test claim: PA required; PA submitted to above mentioned insurance via CoverMyMeds Key/confirmation #/EOC [id number] Status is pending

## 2023-08-23 NOTE — Assessment & Plan Note (Signed)
History obesity with a BMI of 49.  He was started on 2 GLP-1 agonist which she did not tolerate.  He is lost 18 pounds since I last saw him.

## 2023-08-25 ENCOUNTER — Other Ambulatory Visit (HOSPITAL_COMMUNITY): Payer: Self-pay

## 2023-08-25 MED ORDER — REPATHA SURECLICK 140 MG/ML ~~LOC~~ SOAJ: 1.0000 mL | SUBCUTANEOUS | 5 refills | Status: DC

## 2023-08-25 NOTE — Addendum Note (Signed)
Addended by: Cheree Ditto on: 08/25/2023 03:08 PM   Modules accepted: Orders

## 2023-08-25 NOTE — Telephone Encounter (Signed)
Pharmacy Patient Advocate Encounter  Received notification from [facility] that Prior Authorization for REPATHA has been APPROVED from 08/25/23 to 08/24/24. Ran test claim, Copay is $0. This test claim was processed through [facility]- copay amounts may vary at other pharmacies due to pharmacy/plan contracts, or as the patient moves through the different stages of their insurance plan.

## 2023-10-06 ENCOUNTER — Ambulatory Visit: Payer: BC Managed Care – PPO | Admitting: Urology

## 2023-10-25 ENCOUNTER — Ambulatory Visit: Payer: BC Managed Care – PPO | Admitting: Gastroenterology

## 2023-11-17 ENCOUNTER — Other Ambulatory Visit (HOSPITAL_COMMUNITY)
Admission: RE | Admit: 2023-11-17 | Discharge: 2023-11-17 | Disposition: A | Discharge status: Home / Self Care | Payer: BC Managed Care – PPO | Source: Ambulatory Visit | Attending: Cardiovascular Disease | Admitting: Cardiovascular Disease

## 2023-11-17 DIAGNOSIS — E782 Mixed hyperlipidemia: Secondary | ICD-10-CM

## 2023-11-17 LAB — LIPID PANEL
Cholesterol: 171 mg/dL (ref 0–200)
HDL: 45 mg/dL (ref >40)
LDL Cholesterol: 86 mg/dL (ref 0–99)
Total CHOL/HDL Ratio: 3.8 {ratio}
Triglycerides: 199 mg/dL — ABNORMAL HIGH (ref <150)
VLDL: 40 mg/dL (ref 0–40)

## 2023-12-15 ENCOUNTER — Encounter: Payer: Self-pay | Admitting: *Deleted

## 2023-12-16 ENCOUNTER — Other Ambulatory Visit (HOSPITAL_COMMUNITY): Payer: Self-pay

## 2023-12-16 DIAGNOSIS — E782 Mixed hyperlipidemia: Secondary | ICD-10-CM

## 2023-12-16 MED ORDER — EZETIMIBE 10 MG PO TABS: 10.0000 mg | ORAL_TABLET | Freq: Every day | ORAL | 3 refills | Status: DC

## 2024-01-12 ENCOUNTER — Ambulatory Visit: Payer: BC Managed Care – PPO | Admitting: Urology

## 2024-02-16 ENCOUNTER — Ambulatory Visit (INDEPENDENT_AMBULATORY_CARE_PROVIDER_SITE_OTHER): Admitting: Urology

## 2024-02-16 VITALS — BP 116/76 | HR 63

## 2024-02-16 DIAGNOSIS — R350 Frequency of micturition: Secondary | ICD-10-CM

## 2024-02-16 DIAGNOSIS — R351 Nocturia: Secondary | ICD-10-CM | POA: Diagnosis not present

## 2024-02-16 DIAGNOSIS — R3915 Urgency of urination: Secondary | ICD-10-CM | POA: Diagnosis not present

## 2024-02-16 DIAGNOSIS — N200 Calculus of kidney: Secondary | ICD-10-CM

## 2024-02-16 DIAGNOSIS — N281 Cyst of kidney, acquired: Secondary | ICD-10-CM | POA: Diagnosis not present

## 2024-02-16 DIAGNOSIS — R310 Gross hematuria: Secondary | ICD-10-CM

## 2024-02-16 MED ORDER — SOLIFENACIN SUCCINATE 10 MG PO TABS: 10.0000 mg | ORAL_TABLET | Freq: Every day | ORAL | 3 refills | Status: AC

## 2024-02-16 NOTE — Progress Notes (Unsigned)
 Subjective: 1. Renal cyst   2. Nocturia   3. Urinary urgency   4. Renal stones   5. Urinary frequency   6. Urgency of urination   7. Gross hematuria      Consult requested by Deno Flair NP.   02/16/24: Jesse Cook returns today in f/u.  He has had no further hematuria or flank pain.  He is currently on vesicare  5mg  and is doing well during the day but he has nocturia 3-4x. His IPSS is 10.    06/09/23: Jesse Cook returns in f/u.  He had a CT on 06/02/23 that showed a small non-obstructing RUP stone (that was not noted in the report) and a small right renal cyst but no clear finding to explain the hematuria.  His IPSS today is 12 with frequency, urgency with nocturia 2-3x.   He got dry mouth with oxybutynin but didn't respond to the Myrbetriq .    GU hx: Jesse Cook is a 38 yo diabetic who is sent for urinary urgency and frequency.  He has been placed on oxybutynin XL 5mg  daily with an improvement in his symptoms.  He had progressive frequency and urgency over the past year.  He had precipitous urgency but no UUI.  HIs frequency is down to q2hrs and nocturia is down from 3-4x to 0-1x.  He has a good stream.  He has some gross hematuria about 8 months ago and then 6 months ago.  He has no history of stones or GU surgery.  He has some intermittent back pain that can move to side to side but no flank pain or nausea.  He had been on topiramate in the past but not recently.   His glucose control is good on Jardiance and Monjouro.   He is a former smoker but none in 18 to 19 years.   He had a TIA in April and he was on Plavix for 2 weeks and now ASA.  His Calcium  is 10.3 and his Cr was 0.94 on 02/08/23.  His UA is clear.   ROS:  Review of Systems  Musculoskeletal:  Positive for back pain.  Neurological:  Positive for headaches.  All other systems reviewed and are negative.   Allergies  Allergen Reactions   Metformin And Related Swelling    Swelling of mouth     Past Medical History:  Diagnosis Date   Carpal  tunnel syndrome, bilateral upper limbs    Daily headache    "here lately" (08/16/2013)   DM (diabetes mellitus) (HCC)    GERD (gastroesophageal reflux disease)    Hyperlipidemia    Diagnosed 2004   Hypertension    Diagnosed 2000   Lateral femoral cutaneous neuropathy    Sleep apnea    "dx'd; haven't took the sleep study yet" (08/16/2013)   Sleep apnea    CPAP   Stroke [facility])     Past Surgical History:  Procedure Laterality Date   LYMPH NODE BIOPSY  1990's   "throat"     Social History   Socioeconomic History   Marital status: Married    Spouse name: Not on file   Number of children: 0   Years of education: Not on file   Highest education level: Not on file  Occupational History   Occupation: Sports administrator: [employer]  Tobacco Use   Smoking status: Former    Current packs/day: 0.00    Types: Cigarettes    Start date: 07/18/2002    Quit date: 07/19/2003  Years since quitting: 20.5   Smokeless tobacco: Current    Types: Chew   Tobacco comments:    08/16/2013 "pack of cigarettes lasted me a month; dips 2 cans/day"  Vaping Use   Vaping status: Never Used  Substance and Sexual Activity   Alcohol use: Yes    Comment: occ   Drug use: No   Sexual activity: Yes  Other Topics Concern   Not on file  Social History Narrative   Pt lives with wife.    Right handed   Caffeine 40oz daily   Social Drivers of Health   Financial Resource Strain: Low Risk  (01/03/2023)   Received [facility], [facility]   Overall Financial Resource Strain (CARDIA)    Difficulty of Paying Living Expenses: Not hard at all  Food Insecurity: No Food Insecurity (01/03/2023)   Received from [facility], [facility]   Hunger Vital Sign    Worried About Running Out of Food in the Last Year: Never true    Ran Out of Food in the Last Year: Never true  Transportation Needs: No Transportation Needs (01/03/2023)   Received from [facility], [facility]   [facility] -  Transportation    Lack of Transportation (Medical): No    Lack of Transportation (Non-Medical): No  Physical Activity: Inactive (01/03/2023)   Received from [facility], [facility]   Exercise Vital Sign    Days of Exercise per Week: 0 days    Minutes of Exercise per Session: 0 min  Stress: No Stress Concern Present (01/03/2023)   Received from [facility], [facility] of Occupational Health - Occupational Stress Questionnaire    Feeling of Stress : Not at all  Social Connections: Moderately Integrated (01/03/2023)   Received from [facility], [facility]   Social Connection and Isolation Panel [NHANES]    Frequency of Communication with Friends and Family: More than three times a week    Frequency of Social Gatherings with Friends and Family: More than three times a week    Attends Religious Services: 1 to 4 times per year    Active Member of [employer] or Organizations: No    Attends Banker Meetings: Never    Marital Status: Married  Catering manager Violence: Not At Risk (01/03/2023)   Received from [facility], [facility]   Humiliation, Afraid, Rape, and Kick questionnaire    Fear of Current or Ex-Partner: No    Emotionally Abused: No    Physically Abused: No    Sexually Abused: No    Family History  Problem Relation Age of Onset   Diabetes Mother    COPD Mother    Seizures Mother    Asthma Mother    Heart failure Mother    Emphysema Mother    GER disease Mother    Heart disease Father 24       1st MI at 31, total 8 stents   Hypertension Father    Diabetes Father    Hyperlipidemia Father    GER disease Father    Heart disease Brother 49       2 blockages, no stents as of 2014   Kidney disease Brother    Diabetes Brother    Stroke Maternal Grandmother    Lung cancer Maternal Grandfather    COPD Maternal Grandfather    Emphysema Maternal Grandfather    Stroke Paternal  Grandmother    Diabetes Paternal  Grandfather     Anti-infectives: Anti-infectives (From admission, onward)    None       Current Outpatient Medications  Medication Sig Dispense Refill   solifenacin  (VESICARE ) 10 MG tablet Take 1 tablet (10 mg total) by mouth daily. 90 tablet 3   albuterol (VENTOLIN HFA) 108 (90 Base) MCG/ACT inhaler 1 puff as needed Inhalation every 4 hrs     aspirin  EC 81 MG tablet Take 1 tablet (81 mg total) by mouth daily.     atorvastatin  (LIPITOR) 80 MG tablet Take 1 tablet (80 mg total) by mouth daily at 6 PM. (Patient taking differently: Take 40 mg by mouth daily at 6 PM.) 30 tablet 0   empagliflozin (JARDIANCE) 10 MG TABS tablet Take by mouth daily.     ergocalciferol (VITAMIN D2) 1.25 MG (50000 UT) capsule Take 50,000 Units by mouth once a week.     Evolocumab  (REPATHA  SURECLICK) 140 MG/ML SOAJ Inject 140 mg into the skin every 14 (fourteen) days. 2 mL 5   ezetimibe  (ZETIA ) 10 MG tablet Take 1 tablet (10 mg total) by mouth daily. 90 tablet 3   ibuprofen (ADVIL) 800 MG tablet Take 800 mg by mouth every 8 (eight) hours as needed.     lisinopril  (ZESTRIL ) 10 MG tablet Take 10 mg by mouth daily.     loratadine (CLARITIN REDITABS) 10 MG dissolvable tablet Take 10 mg by mouth daily.     metoprolol  tartrate (LOPRESSOR ) 100 MG tablet Take 1 tablet (100mg ) TWO hours prior to CT scan 1 tablet 0   Omega 3-6-9 Fatty Acids (OMEGA 3-6-9 COMPLEX) CAPS as directed Orally     pantoprazole  (PROTONIX ) 40 MG tablet Take 1 tablet (40 mg total) by mouth daily. 90 tablet 3   No current facility-administered medications for this visit.     Objective: Vital signs in last 24 hours: BP 116/76   Pulse 63   Intake/Output from previous day: No intake/output data recorded. Intake/Output this shift: @IOTHISSHIFT @   Physical Exam  Lab Results:  No results found for this or any previous visit (from the past 24 hours).    BMET No results for input(s): "NA", "K", "CL", "CO2", "GLUCOSE", "BUN", "CREATININE",  "CALCIUM " in the last 72 hours. PT/INR No results for input(s): "LABPROT", "INR" in the last 72 hours. ABG No results for input(s): "PHART", "HCO3" in the last 72 hours.  Invalid input(s): "PCO2", "PO2" No results found for this or any previous visit (from the past 2160 hours). UA is clear.  Studies/Results: No results found. No results found.   Assessment/Plan: Urgency and frequency with a history of gross hematuria.  He has had no further hematuria.  His LUTS are improved on vesicare  but he still has nocturia.   I will increase him to 10mg    Right renal stone. He has had no flank pain.    Meds ordered this encounter  Medications   solifenacin  (VESICARE ) 10 MG tablet    Sig: Take 1 tablet (10 mg total) by mouth daily.    Dispense:  90 tablet    Refill:  3     Orders Placed This Encounter  Procedures   Urinalysis, Routine w reflex microscopic     Return in about 6 years (around 02/15/2030) for any available provider.  PVR on return. .    CCDeno Flair NP     Homero Luster 02/17/2024

## 2024-02-17 ENCOUNTER — Encounter: Payer: Self-pay | Admitting: Urology

## 2024-02-28 ENCOUNTER — Encounter: Payer: Self-pay | Admitting: Neurology

## 2024-02-28 ENCOUNTER — Ambulatory Visit (INDEPENDENT_AMBULATORY_CARE_PROVIDER_SITE_OTHER): Payer: BC Managed Care – PPO | Admitting: Neurology

## 2024-02-28 ENCOUNTER — Telehealth: Payer: Self-pay | Admitting: Neurology

## 2024-02-28 VITALS — BP 140/87 | HR 63 | Ht 64.0 in | Wt 303.5 lb

## 2024-02-28 DIAGNOSIS — G44229 Chronic tension-type headache, not intractable: Secondary | ICD-10-CM

## 2024-02-28 DIAGNOSIS — M542 Cervicalgia: Secondary | ICD-10-CM

## 2024-02-28 MED ORDER — TIZANIDINE HCL 4 MG PO TABS: 4.0000 mg | ORAL_TABLET | Freq: Every day | ORAL | 0 refills | Status: DC

## 2024-02-28 NOTE — Progress Notes (Signed)
 [facility]  PATIENT: Jesse Cook DOB: 02-09-1986  REQUESTING CLINICIAN: Bucio, Melodi Sprung, FNP HISTORY FROM: Patient   REASON FOR VISIT: headaches   HISTORICAL  CHIEF COMPLAINT:  Chief Complaint  Patient presents with   Transient Ischemic Attack    RM 13. TIA (CVA). Alone. Pt reports continued headaches for last 6/7 months. Pt reports pain in neck for past 6/7 months and states he feels like neck pain and headaches are connected. Pt reports Dr. Samara Crest ordered a head CT at last appt but pt was unable to complete CT due to having panic attack during procedure. Pt reports not being able to wear his glasses since stroke - vision gets very blurry.     INTERVAL HISTORY 02/28/2024:  Jesse Cook presents today for follow-up, last visit was a year ago, since then he has not had any additional TIA symptoms.  He is compliant with his aspirin  and rest of his medications.   Today his main complaint is headaches and neck pain.  He tells me that he has been suffering with this type of headache for many years, he did see a neurologist in the past and was given shots, unable to tell me the diagnosis or which shot he was given.  Tells me that headaches associated with neck pain that shoot up in the occipital distribution.  Currently he has daily headaches, tells me that he wakes up with headaches, takes daily ibuprofen 800 mg, and by noon the headaches are resolved.  He denies any migrainous features such as nausea, vomiting, sensitivity to light or noise.  However he tells me that he has visual distortion when wearing his glasses, he described them as the image jumping around.  He will see of his ophthalmologist in July, tells me that he is not sure if his headaches are due to him not wearing his glasses. He also has sleep apnea but states that he is compliant with his CPAP machine.    HISTORY OF PRESENT ILLNESS:  This is a 36-year gentleman past medical history of hypertension,  hyperlipidemia, obesity, prediabetes, and previous strokes, right thalamic and left basal ganglia who is presenting after being admitted for TIA.  Patient presented to the ED after waking up with left face and left arm numbness.  Denies any weakness.  He was admitted for TIA/stroke workup.  His head CT was negative for any acute stroke but did show previous right thalamic and left basal ganglia strokes.  CT angiogram did not show any large vessel occlusion.  He was unable to perform the MRI, and his LDL was 111 with hemoglobin A1c of 6.2.  He was discharged home to continue medications and follow-up with neurology.  He reports since discharge from home he has been consistent with aspirin  81 mg and consistent with also his medications.  He reports numbness of the face and left arm got better the next day.  Currently he is asymptomatic.   OTHER MEDICAL CONDITIONS: Obesity, Hypertension, Hyperlipidemia, prediabetes and previous thalamic stroke   REVIEW OF SYSTEMS: Full 14 system review of systems performed and negative with exception of: As noted in the HPI   ALLERGIES: Allergies  Allergen Reactions   Metformin And Related Swelling    Swelling of mouth     HOME MEDICATIONS: Outpatient Medications Prior to Visit  Medication Sig Dispense Refill   albuterol (VENTOLIN HFA) 108 (90 Base) MCG/ACT inhaler 1 puff as needed Inhalation every 4 hrs     aspirin  EC 81 MG  tablet Take 1 tablet (81 mg total) by mouth daily.     atorvastatin  (LIPITOR) 80 MG tablet Take 1 tablet (80 mg total) by mouth daily at 6 PM. (Patient taking differently: Take 40 mg by mouth daily at 6 PM.) 30 tablet 0   empagliflozin (JARDIANCE) 10 MG TABS tablet Take by mouth daily.     ergocalciferol (VITAMIN D2) 1.25 MG (50000 UT) capsule Take 50,000 Units by mouth once a week.     Evolocumab  (REPATHA  SURECLICK) 140 MG/ML SOAJ Inject 140 mg into the skin every 14 (fourteen) days. 2 mL 5   ezetimibe  (ZETIA ) 10 MG tablet Take 1 tablet (10  mg total) by mouth daily. 90 tablet 3   ibuprofen (ADVIL) 800 MG tablet Take 800 mg by mouth every 8 (eight) hours as needed.     lisinopril  (ZESTRIL ) 10 MG tablet Take 10 mg by mouth daily.     loratadine (CLARITIN REDITABS) 10 MG dissolvable tablet Take 10 mg by mouth daily.     Omega 3-6-9 Fatty Acids (OMEGA 3-6-9 COMPLEX) CAPS as directed Orally     pantoprazole  (PROTONIX ) 40 MG tablet Take 1 tablet (40 mg total) by mouth daily. 90 tablet 3   solifenacin  (VESICARE ) 10 MG tablet Take 1 tablet (10 mg total) by mouth daily. 90 tablet 3   metoprolol  tartrate (LOPRESSOR ) 100 MG tablet Take 1 tablet (100mg ) TWO hours prior to CT scan (Patient not taking: Reported on 02/28/2024) 1 tablet 0   No facility-administered medications prior to visit.    PAST MEDICAL HISTORY: Past Medical History:  Diagnosis Date   Carpal tunnel syndrome, bilateral upper limbs    Daily headache    "here lately" (08/16/2013)   DM (diabetes mellitus) (HCC)    GERD (gastroesophageal reflux disease)    Hyperlipidemia    Diagnosed 2004   Hypertension    Diagnosed 2000   Lateral femoral cutaneous neuropathy    Sleep apnea    "dx'd; haven't took the sleep study yet" (08/16/2013)   Sleep apnea    CPAP   Stroke (HCC)     PAST SURGICAL HISTORY: Past Surgical History:  Procedure Laterality Date   LYMPH NODE BIOPSY  1990's   "throat"     FAMILY HISTORY: Family History  Problem Relation Age of Onset   Diabetes Mother    COPD Mother    Seizures Mother    Asthma Mother    Heart failure Mother    Emphysema Mother    GER disease Mother    Heart disease Father 68       1st MI at 28, total 8 stents   Hypertension Father    Diabetes Father    Hyperlipidemia Father    GER disease Father    Heart disease Brother 69       2 blockages, no stents as of 2014   Kidney disease Brother    Diabetes Brother    Stroke Maternal Grandmother    Lung cancer Maternal Grandfather    COPD Maternal Grandfather    Emphysema  Maternal Grandfather    Stroke Paternal Grandmother    Diabetes Paternal Grandfather     SOCIAL HISTORY: Social History   Socioeconomic History   Marital status: Married    Spouse name: Not on file   Number of children: 0   Years of education: Not on file   Highest education level: Not on file  Occupational History   Occupation: Sports administrator: [employer]  Tobacco  Use   Smoking status: Former    Current packs/day: 0.00    Types: Cigarettes    Start date: 07/18/2002    Quit date: 07/19/2003    Years since quitting: 20.6   Smokeless tobacco: Current    Types: Chew   Tobacco comments:    08/16/2013 "pack of cigarettes lasted me a month; dips 2 cans/day"  Vaping Use   Vaping status: Never Used  Substance and Sexual Activity   Alcohol use: Yes    Comment: occ   Drug use: No   Sexual activity: Yes  Other Topics Concern   Not on file  Social History Narrative   Pt lives with wife.    Right handed   Caffeine 40oz daily   Social Drivers of Health   Financial Resource Strain: Low Risk  (01/03/2023)   Received from [facility], [facility]   Overall Financial Resource Strain (CARDIA)    Difficulty of Paying Living Expenses: Not hard at all  Food Insecurity: No Food Insecurity (01/03/2023)   Received from [facility], [facility]   Hunger Vital Sign    Worried About Running Out of Food in the Last Year: Never true    Ran Out of Food in the Last Year: Never true  Transportation Needs: No Transportation Needs (01/03/2023)   Received from [facility], [facility]   [facility] - Transportation    Lack of Transportation (Medical): No    Lack of Transportation (Non-Medical): No  Physical Activity: Inactive (01/03/2023)   Received from [facility], [facility]   Exercise Vital Sign    Days of Exercise per Week: 0 days    Minutes of Exercise per Session: 0 min  Stress: No Stress Concern Present (01/03/2023)   Received from [facility], [facility] - Occupational Stress Questionnaire    Feeling of Stress : Not at all  Social Connections: Moderately Integrated (01/03/2023)   Received from [facility], [facility]   Social Connection and Isolation Panel [NHANES]    Frequency of Communication with Friends and Family: More than three times a week    Frequency of Social Gatherings with Friends and Family: More than three times a week    Attends Religious Services: 1 to 4 times per year    Active Member of [employer] or Organizations: No    Attends Banker Meetings: Never    Marital Status: Married  Catering manager Violence: Not At Risk (01/03/2023)   Received from [facility], [facility]   Humiliation, Afraid, Rape, and Kick questionnaire    Fear of Current or Ex-Partner: No    Emotionally Abused: No    Physically Abused: No    Sexually Abused: No    PHYSICAL EXAM  GENERAL EXAM/CONSTITUTIONAL: Vitals:  Vitals:   02/28/24 1008  BP: (!) 140/87  Pulse: 63  Weight: (!) 303 lb 8 oz (137.7 kg)  Height: 5\' 4"  (1.626 m)   Body mass index is 52.1 kg/m. Wt Readings from Last 3 Encounters:  02/28/24 (!) 303 lb 8 oz (137.7 kg)  08/23/23 285 lb (129.3 kg)  03/11/23 (!) 301 lb 4 oz (136.6 kg)   Patient is in no distress; well developed, nourished and groomed; neck is supple, Obese gentleman   MUSCULOSKELETAL: Gait, strength, tone, movements noted in Neurologic exam below  NEUROLOGIC: MENTAL STATUS:      No data  to display         awake, alert, oriented to person, place and time recent and remote memory intact normal attention and concentration language fluent, comprehension intact, naming intact fund of knowledge appropriate  CRANIAL NERVE:  2nd, 3rd, 4th, 6th - Visual fields full to confrontation, extraocular muscles intact, no nystagmus 5th - facial sensation symmetric 7th - facial strength symmetric 8th - hearing intact 9th - palate  elevates symmetrically, uvula midline 11th - shoulder shrug symmetric 12th - tongue protrusion midline  MOTOR:  normal bulk and tone, full strength in the BUE, BLE  SENSORY:  normal and symmetric to light touch  COORDINATION:  finger-nose-finger, fine finger movements normal  GAIT/STATION:  normal   DIAGNOSTIC DATA (LABS, IMAGING, TESTING) - I reviewed patient records, labs, notes, testing and imaging myself where available.  Lab Results  Component Value Date   WBC 9.9 04/18/2017   HGB 13.8 04/18/2017   HCT 41.2 04/18/2017   MCV 85.3 04/18/2017   PLT 166 04/18/2017      Component Value Date/Time   NA 139 02/08/2023 1126   K 4.7 02/08/2023 1126   CL 101 02/08/2023 1126   CO2 25 02/08/2023 1126   GLUCOSE 97 02/08/2023 1126   GLUCOSE 144 (H) 04/18/2017 2345   BUN 15 02/08/2023 1126   CREATININE 0.94 02/08/2023 1126   CALCIUM  10.3 (H) 02/08/2023 1126   PROT 7.1 04/18/2017 2345   ALBUMIN 4.0 04/18/2017 2345   AST 22 04/18/2017 2345   ALT 23 04/18/2017 2345   ALKPHOS 71 04/18/2017 2345   BILITOT 0.4 04/18/2017 2345   GFRNONAA >60 04/18/2017 2345   GFRAA >60 04/18/2017 2345   Lab Results  Component Value Date   CHOL 171 11/17/2023   HDL 45 11/17/2023   LDLCALC 86 11/17/2023   LDLDIRECT 196 (H) 03/13/2008   TRIG 199 (H) 11/17/2023   CHOLHDL 3.8 11/17/2023   Lab Results  Component Value Date   HGBA1C 5.3 08/16/2013   No results found for: "VITAMINB12" Lab Results  Component Value Date   TSH 1.224 08/16/2013    CT Head 01/03/2023 Age indeterminate lacunar infarcts within the right thalamus and left basal ganglia, new since prior examination as noted on CT arteriogram performed at 6:19 a.m.Aaron Aas If there is clinical concern for acute infarction, MRI examination would be helpful for further evaluation.   CTA Head and Neck 01/03/2023:  1. No emergent finding.  2. 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   HgA1C 6.2  LDL  111  ASSESSMENT AND PLAN  38 y.o. year old male with history of hypertension, hyperlipidemia, obesity, prediabetes, previous stroke, right thalamus and left basal ganglia who is presenting for follow up for TIA and new complaints of headaches.  He is currently on aspirin  81 mg, denies any additional events concerning for TIA or stroke.  His main complaint today is headaches, that he described them as occipital shooting pain associated with neck pain.  Plan will be to send patient to physical therapy, will prescribe tizanidine  nightly to use as muscle relaxant.  Also recommend patient to decrease her ibuprofen to no more than 3 days a week.  He voiced understanding.  Advised him to contact me sooner if medication or PT was not helpful otherwise I will see him in 6 months for follow-up.   1. Chronic tension-type headache, not intractable   2. Cervicalgia      Patient Instructions  Decrease Ibuprofen use to no more than 3 days  a week  Start Tizanidine nightly  Continue your other medications  Referral to physical therapy for management of cervicalgia  Return in 6 months or sooner if worse   Orders Placed This Encounter  Procedures   Ambulatory referral to Physical Therapy    Meds ordered this encounter  Medications   tiZANidine (ZANAFLEX) 4 MG tablet    Sig: Take 1 tablet (4 mg total) by mouth at bedtime.    Dispense:  30 tablet    Refill:  0    Return in about 6 months (around 08/30/2024).    Cassandra Cleveland, MD 02/28/2024, 3:25 PM  [facility] [address] [place], [place] [address] [phone]

## 2024-02-28 NOTE — Telephone Encounter (Signed)
 Referral to Physical Therapy faxed to  [facility])  Phone # [phone] Fax # [phone]

## 2024-02-28 NOTE — Patient Instructions (Signed)
 Decrease Ibuprofen use to no more than 3 days a week  Start Tizanidine  nightly  Continue your other medications  Referral to physical therapy for management of cervicalgia  Return in 6 months or sooner if worse

## 2024-04-16 NOTE — Telephone Encounter (Signed)
 Signed pt orders sent via fax tod to  [facility])   Phone # [phone] Fax # [phone]

## 2024-06-21 ENCOUNTER — Telehealth: Payer: Self-pay | Admitting: Cardiovascular Disease

## 2024-06-21 MED ORDER — LISINOPRIL 10 MG PO TABS: 10.0000 mg | ORAL_TABLET | Freq: Every day | ORAL | 0 refills | Status: DC

## 2024-06-21 NOTE — Telephone Encounter (Signed)
*  STAT* If patient is at the pharmacy, call can be transferred to refill team.   1. Which medications need to be refilled? (please list name of each medication and dose if known)   lisinopril  (ZESTRIL ) 10 MG tablet     2. Would you like to learn more about the convenience, safety, & potential cost savings by using the [facility]? No   3. Are you open to using the [facility] (Type [facility]. No   4. Which pharmacy/location (including street and city if local pharmacy) is medication to be sent to?[facility] [address], [place] - [address]    5. Do they need a 30 day or 90 day supply? 90 day   Pt is out of medication.   Pt has appt. 11/18 with Court

## 2024-06-21 NOTE — Telephone Encounter (Signed)
 Pt's medication was sent to pt's pharmacy as requested. Confirmation received.

## 2024-08-20 ENCOUNTER — Ambulatory Visit: Payer: Self-pay | Admitting: Cardiovascular Disease

## 2024-08-20 ENCOUNTER — Other Ambulatory Visit (HOSPITAL_COMMUNITY): Payer: Self-pay | Admitting: Emergency Medicine

## 2024-08-20 ENCOUNTER — Ambulatory Visit (HOSPITAL_COMMUNITY)
Admission: RE | Admit: 2024-08-20 | Discharge: 2024-08-20 | Disposition: A | Discharge status: Home / Self Care | Payer: Self-pay | Source: Ambulatory Visit | Attending: Emergency Medicine | Admitting: Emergency Medicine

## 2024-08-20 DIAGNOSIS — R531 Weakness: Secondary | ICD-10-CM

## 2024-08-20 MED ORDER — IOHEXOL 350 MG/ML SOLN
75.0000 mL | Freq: Once | INTRAVENOUS | Status: AC | PRN
  Administered 2024-08-20: 75 mL via INTRAVENOUS

## 2024-08-22 ENCOUNTER — Ambulatory Visit: Payer: Self-pay | Admitting: Urology

## 2024-09-12 ENCOUNTER — Ambulatory Visit: Payer: Self-pay

## 2024-09-12 VITALS — BP 119/81 | HR 80 | Resp 18 | Ht 64.0 in | Wt 303.0 lb

## 2024-09-12 DIAGNOSIS — I10 Essential (primary) hypertension: Secondary | ICD-10-CM

## 2024-09-12 DIAGNOSIS — Z7689 Persons encountering health services in other specified circumstances: Secondary | ICD-10-CM

## 2024-09-12 DIAGNOSIS — G459 Transient cerebral ischemic attack, unspecified: Secondary | ICD-10-CM

## 2024-09-12 NOTE — Progress Notes (Signed)
 New Patient Office Visit  Subjective    Patient ID: Jesse Cook, male    DOB: 20-Mar-1986  Age: 38 y.o. MRN: [id number]  CC:  Chief Complaint  Patient presents with   Establish Care    New pt     HPI Jesse Cook presents to establish care He was recently admitted to [facility] on 08/20/24 for the following:  38 y.o. male with a PMH significant for previous history of TIA, CVA, HTN, OSA, super morbid obesity and HLD who presented to the ED with acute onset of left-sided facial numbness and tingling which the patient reports occurred at about 10:00pm last night as he was about to retire to bed. He states that he noticed the sensation but went to bed and ignored it. He then reports that he awoke at about 1115pm and noticed that the numbness and tingling was now being felt in his left upper extremity particularly his hand and he went back to bed and noticed that his left lower extremity was tingly and numb. He reports awaking his wife who advised him to come to the ED for further evaluation. The patient denies acute headaches but states he chronically has headaches, denies visual changes, confusion, falls, unsteadiness, fever, chills, nausea, vomiting, abdominal pain or diarrhea.   The patient was admitted to the medical floor for observation with plan to obtain MRI, unfortunately given his body habitus he was not able to fit into the MRI machine. Since his CT was negative he will be treated with dual antiplatelet therapy for 21 days. Recommend outpatient follow-up with his PCP to obtain an open MRI to evaluate his neurologic status further.   Outpatient Encounter Medications as of 09/12/2024  Medication Sig   albuterol (VENTOLIN HFA) 108 (90 Base) MCG/ACT inhaler 1 puff as needed Inhalation every 4 hrs   aspirin  EC 81 MG tablet Take 1 tablet (81 mg total) by mouth daily.   atorvastatin  (LIPITOR) 80 MG tablet Take 1 tablet (80 mg total) by mouth daily at 6 PM.   clopidogrel (PLAVIX) 75 MG  tablet Take 75 mg by mouth daily.   empagliflozin (JARDIANCE) 10 MG TABS tablet Take by mouth daily.   ergocalciferol (VITAMIN D2) 1.25 MG (50000 UT) capsule Take 50,000 Units by mouth once a week.   Evolocumab  (REPATHA  SURECLICK) 140 MG/ML SOAJ Inject 140 mg into the skin every 14 (fourteen) days.   ibuprofen (ADVIL) 800 MG tablet Take 800 mg by mouth every 8 (eight) hours as needed.   lisinopril  (ZESTRIL ) 10 MG tablet Take 1 tablet (10 mg total) by mouth daily.   loratadine (CLARITIN REDITABS) 10 MG dissolvable tablet Take 10 mg by mouth daily.   metoprolol  tartrate (LOPRESSOR ) 100 MG tablet Take 1 tablet (100mg ) TWO hours prior to CT scan   Omega 3-6-9 Fatty Acids (OMEGA 3-6-9 COMPLEX) CAPS as directed Orally   pantoprazole  (PROTONIX ) 40 MG tablet Take 1 tablet (40 mg total) by mouth daily.   solifenacin  (VESICARE ) 10 MG tablet Take 1 tablet (10 mg total) by mouth daily.   [DISCONTINUED] ezetimibe  (ZETIA ) 10 MG tablet Take 1 tablet (10 mg total) by mouth daily. (Patient not taking: Reported on 09/12/2024)   [DISCONTINUED] tiZANidine  (ZANAFLEX ) 4 MG tablet Take 1 tablet (4 mg total) by mouth at bedtime. (Patient not taking: Reported on 09/12/2024)   No facility-administered encounter medications on file as of 09/12/2024.    Past Medical History:  Diagnosis Date   Allergy    Carpal tunnel  syndrome, bilateral upper limbs    Daily headache    here lately (08/16/2013)   DM (diabetes mellitus) (HCC)    GERD (gastroesophageal reflux disease)    Hyperlipidemia    Diagnosed 2004   Hypertension    Diagnosed 2000   Lateral femoral cutaneous neuropathy    Sleep apnea    dx'd; haven't took the sleep study yet (08/16/2013)   Sleep apnea    CPAP   Stroke [facility])     Past Surgical History:  Procedure Laterality Date   LYMPH NODE BIOPSY  1990's   throat     Family History  Problem Relation Age of Onset   Diabetes Mother    COPD Mother    Seizures Mother    Asthma Mother     Heart failure Mother    Emphysema Mother    GER disease Mother    Cancer Mother    Heart disease Mother    Heart disease Father 46       1st MI at 50, total 8 stents   Hypertension Father    Diabetes Father    Hyperlipidemia Father    GER disease Father    Heart disease Brother 43       2 blockages, no stents as of 2014   Kidney disease Brother    Diabetes Brother    Stroke Maternal Grandmother    Lung cancer Maternal Grandfather    COPD Maternal Grandfather    Emphysema Maternal Grandfather    Stroke Paternal Grandmother    Diabetes Paternal Grandfather     Social History   Socioeconomic History   Marital status: Married    Spouse name: Not on file   Number of children: 0   Years of education: Not on file   Highest education level: 8th grade  Occupational History   Occupation: Sports Administrator: [employer]  Tobacco Use   Smoking status: Former    Current packs/day: 0.00    Types: Cigarettes    Start date: 07/18/2002    Quit date: 07/19/2003    Years since quitting: 21.1   Smokeless tobacco: Current    Types: Chew   Tobacco comments:    08/16/2013 pack of cigarettes lasted me a month; dips 2 cans/day  Vaping Use   Vaping status: Never Used  Substance and Sexual Activity   Alcohol use: Not Currently    Comment: occ   Drug use: Never   Sexual activity: Yes  Other Topics Concern   Not on file  Social History Narrative   Pt lives with wife.    Right handed   Caffeine 40oz daily   Social Drivers of Health   Tobacco Use: High Risk (09/12/2024)   Patient History    Smoking Tobacco Use: Former    Smokeless Tobacco Use: Current    Passive Exposure: Not on file  Financial Resource Strain: Low Risk (09/12/2024)   Overall Financial Resource Strain (CARDIA)    Difficulty of Paying Living Expenses: Not very hard  Food Insecurity: No Food Insecurity (09/12/2024)   Epic    Worried About Programme Researcher, Broadcasting/film/video in the Last Year: Never true    Ran Out of Food in  the Last Year: Never true  Transportation Needs: No Transportation Needs (09/11/2024)   Epic    Lack of Transportation (Medical): No    Lack of Transportation (Non-Medical): No  Physical Activity: Inactive (09/12/2024)   Exercise Vital Sign    Days of Exercise per  Week: 0 days    Minutes of Exercise per Session: 0 min  Stress: No Stress Concern Present (09/12/2024)   [employer] of Occupational Health - Occupational Stress Questionnaire    Feeling of Stress: Not at all  Social Connections: Moderately Isolated (09/12/2024)   Social Connection and Isolation Panel    Frequency of Communication with Friends and Family: More than three times a week    Frequency of Social Gatherings with Friends and Family: More than three times a week    Attends Religious Services: Patient declined    Active Member of Clubs or Organizations: No    Attends Banker Meetings: Never    Marital Status: Married  Catering Manager Violence: Not At Risk (09/12/2024)   Epic    Fear of Current or Ex-Partner: No    Emotionally Abused: No    Physically Abused: No    Sexually Abused: No  Depression (PHQ2-9): Low Risk (09/12/2024)   Depression (PHQ2-9)    PHQ-2 Score: 3  Alcohol Screen: Low Risk (09/12/2024)   Alcohol Screen    Last Alcohol Screening Score (AUDIT): 1  Housing: Low Risk (09/12/2024)   Epic    Unable to Pay for Housing in the Last Year: No    Number of Times Moved in the Last Year: 0    Homeless in the Last Year: No  Utilities: Not At Risk (09/12/2024)   Epic    Threatened with loss of utilities: No  Health Literacy: Adequate Health Literacy (09/12/2024)   B1300 Health Literacy    Frequency of need for help with medical instructions: Rarely  Recent Concern: Health Literacy - Medium Risk (08/20/2024)   Received from [facility] Literacy    How often do you need to have someone help you when you read instructions, pamphlets, or other written material from your  doctor or pharmacy?: Sometimes   ROS     Objective    BP 119/81   Pulse 80   Resp 18   Ht 5' 4 (1.626 m)   Wt (!) 303 lb 0.6 oz (137.5 kg)   SpO2 97%   BMI 52.02 kg/m   Physical Exam Vitals and nursing note reviewed.  Constitutional:      Appearance: Normal appearance. He is obese.  HENT:     Head: Normocephalic.     Right Ear: Tympanic membrane, ear canal and external ear normal.     Left Ear: Tympanic membrane, ear canal and external ear normal.     Nose: Nose normal.     Mouth/Throat:     Mouth: Mucous membranes are moist.     Pharynx: Oropharynx is clear.  Eyes:     Extraocular Movements: Extraocular movements intact.     Pupils: Pupils are equal, round, and reactive to light.  Cardiovascular:     Rate and Rhythm: Normal rate and regular rhythm.  Pulmonary:     Effort: Pulmonary effort is normal.     Breath sounds: Normal breath sounds.  Abdominal:     General: Abdomen is protuberant. Bowel sounds are normal.     Palpations: Abdomen is soft.  Musculoskeletal:     Cervical back: Normal range of motion and neck supple.  Skin:    General: Skin is warm and dry.  Neurological:     Mental Status: He is alert and oriented to person, place, and time.  Psychiatric:        Mood and Affect: Mood normal.  Thought Content: Thought content normal.         Assessment & Plan:   Problem List Items Addressed This Visit       Cardiovascular and Mediastinum   HYPERTENSION, BENIGN SYSTEMIC   Blood pressure well-controlled.  Continue with current medications.      TIA (transient ischemic attack) - Primary   Recent hospitalization. On Plavix with four days remaining. Transient arm numbness resolved. Neurology appointment rescheduled. - Continue Plavix for four more days.      Other Visit Diagnoses       Encounter to establish care       Establishing care with a new primary care provider. No acute issues. - Scheduled follow-up in six months for yearly  physical.         Return in about 6 months (around 03/13/2025) for chronic follow-up with PCP.   Leita Longs, FNP

## 2024-09-13 DIAGNOSIS — G459 Transient cerebral ischemic attack, unspecified: Secondary | ICD-10-CM | POA: Insufficient documentation

## 2024-09-13 NOTE — Assessment & Plan Note (Signed)
 Blood pressure well-controlled.  Continue with current medications

## 2024-09-13 NOTE — Assessment & Plan Note (Signed)
 Recent hospitalization. On Plavix with four days remaining. Transient arm numbness resolved. Neurology appointment rescheduled. - Continue Plavix for four more days.

## 2024-09-18 ENCOUNTER — Ambulatory Visit: Admitting: Neurology

## 2024-10-01 ENCOUNTER — Other Ambulatory Visit: Payer: Self-pay

## 2024-10-01 ENCOUNTER — Telehealth: Payer: Self-pay

## 2024-10-01 NOTE — Telephone Encounter (Signed)
 Recommend reaching out to cardiology or discussing at upcoming office visit about refills, since we do not fill any of his medications

## 2024-10-01 NOTE — Telephone Encounter (Signed)
 Copied from CRM [id number]. Topic: Clinical - Prescription Issue >> Oct 01, 2024  1:36 PM Donna BRAVO wrote: Reason for CRM:  Jesse Cook to [facility] (supporting Leita Longs, FNP)     09/30/24 11:59 AM I need to have all medications filled I have received my new insurance card. Is this something that you can help me with?  Patient calling stating he needs all medication sent to this pharmacy due to new insurance.  [facility] [address] [place], [place] [address] Phone [phone]

## 2024-10-10 ENCOUNTER — Encounter: Payer: Self-pay | Admitting: Cardiovascular Disease

## 2024-10-10 ENCOUNTER — Other Ambulatory Visit (HOSPITAL_COMMUNITY): Payer: Self-pay

## 2024-10-10 ENCOUNTER — Ambulatory Visit: Payer: PRIVATE HEALTH INSURANCE | Attending: Cardiovascular Disease | Admitting: Cardiovascular Disease

## 2024-10-10 ENCOUNTER — Telehealth: Payer: Self-pay | Admitting: Pharmacy Technician

## 2024-10-10 VITALS — BP 130/88 | HR 56 | Ht 64.0 in | Wt 303.0 lb

## 2024-10-10 DIAGNOSIS — F17200 Nicotine dependence, unspecified, uncomplicated: Secondary | ICD-10-CM | POA: Diagnosis not present

## 2024-10-10 DIAGNOSIS — E782 Mixed hyperlipidemia: Secondary | ICD-10-CM

## 2024-10-10 DIAGNOSIS — G4733 Obstructive sleep apnea (adult) (pediatric): Secondary | ICD-10-CM | POA: Diagnosis not present

## 2024-10-10 DIAGNOSIS — I10 Essential (primary) hypertension: Secondary | ICD-10-CM | POA: Diagnosis not present

## 2024-10-10 DIAGNOSIS — R931 Abnormal findings on diagnostic imaging of heart and coronary circulation: Secondary | ICD-10-CM | POA: Diagnosis not present

## 2024-10-10 MED ORDER — EMPAGLIFLOZIN 10 MG PO TABS: 10.0000 mg | ORAL_TABLET | Freq: Every day | ORAL | 3 refills | Status: AC

## 2024-10-10 MED ORDER — ATORVASTATIN CALCIUM 80 MG PO TABS: 80.0000 mg | ORAL_TABLET | Freq: Every day | ORAL | 3 refills | Status: AC

## 2024-10-10 MED ORDER — LISINOPRIL 10 MG PO TABS: 10.0000 mg | ORAL_TABLET | Freq: Every day | ORAL | 3 refills | Status: AC

## 2024-10-10 MED ORDER — REPATHA SURECLICK 140 MG/ML ~~LOC~~ SOAJ: 1.0000 mL | SUBCUTANEOUS | 4 refills | Status: AC

## 2024-10-10 NOTE — Assessment & Plan Note (Signed)
 History of hyperlipidemia on high-dose statin therapy and Repatha  with lipid profile performed 11/17/2023 revealing total cholesterol 171, LDL 86 and HDL 45.  This was done prior to initiating Repatha .  Will recheck a fasting lipid and liver profile this morning.

## 2024-10-10 NOTE — Progress Notes (Signed)
 "     10/10/2024 Jesse Cook [facility]   October 08, 1985  [id number]  Primary Physician Jesse Doffing, FNP Primary Cardiologist: Jesse JINNY Lesches MD FACP, [place], [place], [place]  HPI:  Jesse Cook is a 39 y.o.  morbidly overweight married Caucasian male with no children who was referred to me by the emergency room at [facility] after recently being seen on 01/04/2023 for chest pain. His father, Jesse Cook, is also a patient of mine who had his first heart attack at age 74..  I last saw him in the office 08/23/2023.  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 His risk factors include treated hypertension, diabetes and hyperlipidemia. He does have family history of a father who had coronary intervention 30 years ago in his late 87s. He has never had a heart attack but had question TIA when he was seen recently with CT abnormalities. He had chest pain which is fairly infrequent and he will his blood pressure was under better control over the last year. He was seen at [facility] on 4/2 with chest pain, left facial and upper extremity numbness. He ruled out for myocardial infarction. CT scan did show although lacunar infarcts.   I obtained a coronary CTA on him 02/16/2023 revealing a coronary calcium  score of 33 with minimal nonobstructive CAD.  He has not had any recurrent chest pain.  He has tried to lose weight on GLP-1 agonist unsuccessfully  Since I saw him a year ago he remained stable.  He does have obstructive sleep apnea on CPAP.  He denies chest pain or shortness of breath.  He did start Repatha  since I saw him last.  Active Medications[1]   Allergies[2]  Social History   Socioeconomic History   Marital status: Married    Spouse name: Not on file   Number of children: 0   Years of education: Not on file   Highest education level: 8th grade  Occupational History   Occupation: Sports Administrator: [employer]  Tobacco Use   Smoking status: Former    Current packs/day: 0.00     Types: Cigarettes    Start date: 07/18/2002    Quit date: 07/19/2003    Years since quitting: 21.2   Smokeless tobacco: Current    Types: Chew   Tobacco comments:    08/16/2013 pack of cigarettes lasted me a month; dips 2 cans/day  Vaping Use   Vaping status: Never Used  Substance and Sexual Activity   Alcohol use: Not Currently    Comment: occ   Drug use: Never   Sexual activity: Yes  Other Topics Concern   Not on file  Social History Narrative   Pt lives with wife.    Right handed   Caffeine 40oz daily   Social Drivers of Health   Tobacco Use: High Risk (10/10/2024)   Patient History    Smoking Tobacco Use: Former    Smokeless Tobacco Use: Current    Passive Exposure: Not on file  Financial Resource Strain: Low Risk (09/12/2024)   Overall Financial Resource Strain (CARDIA)    Difficulty of Paying Living Expenses: Not very hard  Food Insecurity: No Food Insecurity (09/12/2024)   Epic    Worried About Radiation Protection Practitioner of Food in the Last Year: Never true    Ran Out of Food in the Last Year: Never true  Transportation Needs: No Transportation Needs (09/11/2024)   Epic    Lack  of Transportation (Medical): No    Lack of Transportation (Non-Medical): No  Physical Activity: Inactive (09/12/2024)   Exercise Vital Sign    Days of Exercise per Week: 0 days    Minutes of Exercise per Session: 0 min  Stress: No Stress Concern Present (09/12/2024)   [employer] of Occupational Health - Occupational Stress Questionnaire    Feeling of Stress: Not at all  Social Connections: Moderately Isolated (09/12/2024)   Social Connection and Isolation Panel    Frequency of Communication with Friends and Family: More than three times a week    Frequency of Social Gatherings with Friends and Family: More than three times a week    Attends Religious Services: Patient declined    Active Member of Clubs or Organizations: No    Attends Banker Meetings: Never    Marital  Status: Married  Catering Manager Violence: Not At Risk (09/12/2024)   Epic    Fear of Current or Ex-Partner: No    Emotionally Abused: No    Physically Abused: No    Sexually Abused: No  Depression (PHQ2-9): Low Risk (09/12/2024)   Depression (PHQ2-9)    PHQ-2 Score: 3  Alcohol Screen: Low Risk (09/12/2024)   Alcohol Screen    Last Alcohol Screening Score (AUDIT): 1  Housing: Low Risk (09/12/2024)   Epic    Unable to Pay for Housing in the Last Year: No    Number of Times Moved in the Last Year: 0    Homeless in the Last Year: No  Utilities: Not At Risk (09/12/2024)   Epic    Threatened with loss of utilities: No  Health Literacy: Adequate Health Literacy (09/12/2024)   B1300 Health Literacy    Frequency of need for help with medical instructions: Rarely  Recent Concern: Health Literacy - Medium Risk (08/20/2024)   Received from [facility] Literacy    How often do you need to have someone help you when you read instructions, pamphlets, or other written material from your doctor or pharmacy?: Sometimes     Review of Systems: General: negative for chills, fever, night sweats or weight changes.  Cardiovascular: negative for chest pain, dyspnea on exertion, edema, orthopnea, palpitations, paroxysmal nocturnal dyspnea or shortness of breath Dermatological: negative for rash Respiratory: negative for cough or wheezing Urologic: negative for hematuria Abdominal: negative for nausea, vomiting, diarrhea, bright red blood per rectum, melena, or hematemesis Neurologic: negative for visual changes, syncope, or dizziness All other systems reviewed and are otherwise negative except as noted above.    Blood pressure 130/88, pulse (!) 56, height 5' 4 (1.626 m), weight (!) 303 lb (137.4 kg), SpO2 95%.  General appearance: alert and no distress Neck: no adenopathy, no carotid bruit, no JVD, supple, symmetrical, trachea midline, and thyroid not enlarged, symmetric, no  tenderness/mass/nodules Lungs: clear to auscultation bilaterally Heart: regular rate and rhythm, S1, S2 normal, no murmur, click, rub or gallop Extremities: extremities normal, atraumatic, no cyanosis or edema Pulses: 2+ and symmetric Skin: Skin color, texture, turgor normal. No rashes or lesions Neurologic: Grossly normal  EKG EKG Interpretation Date/Time:  Wednesday October 10 2024 10:12:12 EST Ventricular Rate:  56 PR Interval:  160 QRS Duration:  94 QT Interval:  384 QTC Calculation: 370 R Axis:   -22  Text Interpretation: Sinus bradycardia When compared with ECG of 23-Aug-2023 09:41, No significant change was found Confirmed by Court Carrier [id number]) on 10/10/2024 10:25:02 AM    ASSESSMENT AND PLAN:  Hyperlipidemia History of hyperlipidemia on high-dose statin therapy and Repatha  with lipid profile performed 11/17/2023 revealing total cholesterol 171, LDL 86 and HDL 45.  This was done prior to initiating Repatha .  Will recheck a fasting lipid and liver profile this morning.  TOBACCO USER He chews tobacco  Obstructive sleep apnea On CPAP  Elevated coronary artery calcium  score Coronary calcium  score performed 02/16/2023 was elevated at 33 with minimal nonobstructive CAD by CTA.  He denies chest pain.     Jesse DOROTHA Lesches MD FACP,FACC,FAHA, [facility] 10/10/2024 10:30 AM    [1]  Current Meds  Medication Sig   albuterol (VENTOLIN HFA) 108 (90 Base) MCG/ACT inhaler 1 puff as needed Inhalation every 4 hrs   aspirin  EC 81 MG tablet Take 1 tablet (81 mg total) by mouth daily.   atorvastatin  (LIPITOR) 80 MG tablet Take 1 tablet (80 mg total) by mouth daily at 6 PM.   clopidogrel (PLAVIX) 75 MG tablet Take 75 mg by mouth daily.   empagliflozin  (JARDIANCE ) 10 MG TABS tablet Take by mouth daily.   ergocalciferol (VITAMIN D2) 1.25 MG (50000 UT) capsule Take 50,000 Units by mouth once a week.   Evolocumab  (REPATHA  SURECLICK) 140 MG/ML SOAJ Inject 140 mg into the skin every 14  (fourteen) days.   ibuprofen (ADVIL) 800 MG tablet Take 800 mg by mouth every 8 (eight) hours as needed.   lisinopril  (ZESTRIL ) 10 MG tablet Take 1 tablet (10 mg total) by mouth daily.   loratadine (CLARITIN REDITABS) 10 MG dissolvable tablet Take 10 mg by mouth daily.   metoprolol  tartrate (LOPRESSOR ) 100 MG tablet Take 1 tablet (100mg ) TWO hours prior to CT scan   Omega 3-6-9 Fatty Acids (OMEGA 3-6-9 COMPLEX) CAPS as directed Orally   pantoprazole  (PROTONIX ) 40 MG tablet Take 1 tablet (40 mg total) by mouth daily.   solifenacin  (VESICARE ) 10 MG tablet Take 1 tablet (10 mg total) by mouth daily.  [2]  Allergies Allergen Reactions   Metformin And Related Swelling    Swelling of mouth    "

## 2024-10-10 NOTE — Addendum Note (Signed)
 Addended by: Meryem Haertel L on: 10/10/2024 02:11 PM   Modules accepted: Orders

## 2024-10-10 NOTE — Patient Instructions (Signed)
 Medication Instructions:  Your physician recommends that you continue on your current medications as directed. Please refer to the Current Medication list given to you today.  *If you need a refill on your cardiac medications before your next appointment, please call your pharmacy*  Lab Work: Today- Lipid/liver panel   If you have labs (blood work) drawn today and your tests are completely normal, you will receive your results only by: MyChart Message (if you have MyChart) OR A paper copy in the mail If you have any lab test that is abnormal or we need to change your treatment, we will call you to review the results.   Follow-Up: At [facility], you and your health needs are our priority.  As part of our continuing mission to provide you with exceptional heart care, our providers are all part of one team.  This team includes your primary Cardiologist (physician) and Advanced Practice Providers or APPs (Physician Assistants and Nurse Practitioners) who all work together to provide you with the care you need, when you need it.  Your next appointment:   12 month(s)  Provider:   Dorn Lesches, MD

## 2024-10-10 NOTE — Assessment & Plan Note (Signed)
"  On CPAP         "

## 2024-10-10 NOTE — Assessment & Plan Note (Signed)
 Coronary calcium  score performed 02/16/2023 was elevated at 33 with minimal nonobstructive CAD by CTA.  He denies chest pain.

## 2024-10-10 NOTE — Telephone Encounter (Signed)
 Pharmacy Patient Advocate Encounter   Received notification from Physician's Office that prior authorization for repatha  is required/requested.   Insurance verification completed.   The patient is insured through [facility].   Per test claim: The current 10/10/24 day co-pay is, $34.99- one month.  No PA needed at this time. This test claim was processed through [facility]- copay amounts may vary at other pharmacies due to pharmacy/plan contracts, or as the patient moves through the different stages of their insurance plan.

## 2024-10-10 NOTE — Assessment & Plan Note (Signed)
 He chews tobacco.

## 2024-10-11 ENCOUNTER — Ambulatory Visit: Payer: Self-pay | Admitting: Cardiovascular Disease

## 2024-10-11 DIAGNOSIS — E782 Mixed hyperlipidemia: Secondary | ICD-10-CM

## 2024-10-11 DIAGNOSIS — R931 Abnormal findings on diagnostic imaging of heart and coronary circulation: Secondary | ICD-10-CM

## 2024-10-11 LAB — LIPID PANEL
Chol/HDL Ratio: 6.3 ratio — ABNORMAL HIGH (ref 0.0–5.0)
Cholesterol, Total: 252 mg/dL — ABNORMAL HIGH (ref 100–199)
HDL: 40 mg/dL
LDL Chol Calc (NIH): 177 mg/dL — ABNORMAL HIGH (ref 0–99)
Triglycerides: 188 mg/dL — ABNORMAL HIGH (ref 0–149)
VLDL Cholesterol Cal: 35 mg/dL (ref 5–40)

## 2024-10-11 LAB — HEPATIC FUNCTION PANEL
ALT: 51 IU/L — ABNORMAL HIGH (ref 0–44)
AST: 24 IU/L (ref 0–40)
Albumin: 4.7 g/dL (ref 4.1–5.1)
Alkaline Phosphatase: 96 IU/L (ref 47–123)
Bilirubin Total: 0.5 mg/dL (ref 0.0–1.2)
Bilirubin, Direct: 0.17 mg/dL (ref 0.00–0.40)
Total Protein: 7.2 g/dL (ref 6.0–8.5)

## 2024-11-07 ENCOUNTER — Encounter: Payer: Self-pay | Admitting: Neurology

## 2024-11-07 ENCOUNTER — Telehealth: Payer: Self-pay | Admitting: Neurology

## 2024-11-07 ENCOUNTER — Ambulatory Visit: Payer: PRIVATE HEALTH INSURANCE | Admitting: Neurology

## 2024-11-07 VITALS — BP 139/94 | HR 90 | Ht 64.0 in | Wt 296.0 lb

## 2024-11-07 DIAGNOSIS — I6381 Other cerebral infarction due to occlusion or stenosis of small artery: Secondary | ICD-10-CM

## 2024-11-07 DIAGNOSIS — G44229 Chronic tension-type headache, not intractable: Secondary | ICD-10-CM

## 2024-11-07 DIAGNOSIS — G459 Transient cerebral ischemic attack, unspecified: Secondary | ICD-10-CM

## 2024-11-07 MED ORDER — PROPRANOLOL HCL ER 60 MG PO CP24: 60.0000 mg | ORAL_CAPSULE | Freq: Every day | ORAL | 0 refills | Status: AC

## 2024-11-07 NOTE — Progress Notes (Signed)
 "   [facility]  PATIENT: Jesse Cook DOB: Jun 08, 1986  REQUESTING CLINICIAN: Bucio, Silvio BROCKS, FNP HISTORY FROM: Patient   REASON FOR VISIT: headaches   HISTORICAL  CHIEF COMPLAINT:  Chief Complaint  Patient presents with   Follow-up    Room 12 Alone Chronic tension-type headache, not intractable Headache daily in the morning       INTERVAL HISTORY 11/07/2024 Patient presents today for follow-up, last visit was in May 2025.  He tells me he has lost his insurance and therefore was lost to follow-up.  He reports back in November 2025, he was admitted to the hospital with new left-sided deficit.  CT scan did not show any acute stroke, angiogram did not show any severe stenosis, he did not have MRI brain but was recommended to have one.  He tells me that his symptom has resolved  His other complaint today are his headaches.  Continues to have tension type headache 3-4 times a week.  He does take Tylenol  and Aleve with some relief.  Does not have any migrainous features. At last visit in May we have referred him to physical therapy for a tension type headache.  He tells me that physical therapy was helpful during that time.   INTERVAL HISTORY 02/28/2024:  Jesse Cook presents today for follow-up, last visit was a year ago, since then he has not had any additional TIA symptoms.  He is compliant with his aspirin  and rest of his medications.   Today his main complaint is headaches and neck pain.  He tells me that he has been suffering with this type of headache for many years, he did see a neurologist in the past and was given shots, unable to tell me the diagnosis or which shot he was given.  Tells me that headaches associated with neck pain that shoot up in the occipital distribution.  Currently he has daily headaches, tells me that he wakes up with headaches, takes daily ibuprofen 800 mg, and by noon the headaches are resolved.  He denies any migrainous features such as nausea,  vomiting, sensitivity to light or noise.  However he tells me that he has visual distortion when wearing his glasses, he described them as the image jumping around.  He will see of his ophthalmologist in July, tells me that he is not sure if his headaches are due to him not wearing his glasses. He also has sleep apnea but states that he is compliant with his CPAP machine.    HISTORY OF PRESENT ILLNESS:  This is a 39-year gentleman past medical history of hypertension, hyperlipidemia, obesity, prediabetes, and previous strokes, right thalamic and left basal ganglia who is presenting after being admitted for TIA.  Patient presented to the ED after waking up with left face and left arm numbness.  Denies any weakness.  He was admitted for TIA/stroke workup.  His head CT was negative for any acute stroke but did show previous right thalamic and left basal ganglia strokes.  CT angiogram did not show any large vessel occlusion.  He was unable to perform the MRI, and his LDL was 111 with hemoglobin A1c of 6.2.  He was discharged home to continue medications and follow-up with neurology.  He reports since discharge from home he has been consistent with aspirin  81 mg and consistent with also his medications.  He reports numbness of the face and left arm got better the next day.  Currently he is asymptomatic.   OTHER MEDICAL CONDITIONS:  Obesity, Hypertension, Hyperlipidemia, prediabetes and previous thalamic stroke   REVIEW OF SYSTEMS: Full 14 system review of systems performed and negative with exception of: As noted in the HPI   ALLERGIES: Allergies  Allergen Reactions   Metformin And Related Swelling    Swelling of mouth     HOME MEDICATIONS: Outpatient Medications Prior to Visit  Medication Sig Dispense Refill   albuterol (VENTOLIN HFA) 108 (90 Base) MCG/ACT inhaler 1 puff as needed Inhalation every 4 hrs     aspirin  EC 81 MG tablet Take 1 tablet (81 mg total) by mouth daily.     atorvastatin   (LIPITOR) 80 MG tablet Take 1 tablet (80 mg total) by mouth daily at 6 PM. 90 tablet 3   clopidogrel (PLAVIX) 75 MG tablet Take 75 mg by mouth daily.     empagliflozin  (JARDIANCE ) 10 MG TABS tablet Take 1 tablet (10 mg total) by mouth daily. 90 tablet 3   ergocalciferol (VITAMIN D2) 1.25 MG (50000 UT) capsule Take 50,000 Units by mouth once a week.     Evolocumab  (REPATHA  SURECLICK) 140 MG/ML SOAJ Inject 140 mg into the skin every 14 (fourteen) days. 6 mL 4   lisinopril  (ZESTRIL ) 10 MG tablet Take 1 tablet (10 mg total) by mouth daily. 90 tablet 3   loratadine (CLARITIN REDITABS) 10 MG dissolvable tablet Take 10 mg by mouth daily.     Omega 3-6-9 Fatty Acids (OMEGA 3-6-9 COMPLEX) CAPS as directed Orally     pantoprazole  (PROTONIX ) 40 MG tablet Take 1 tablet (40 mg total) by mouth daily. 90 tablet 3   solifenacin  (VESICARE ) 10 MG tablet Take 1 tablet (10 mg total) by mouth daily. 90 tablet 3   ibuprofen (ADVIL) 800 MG tablet Take 800 mg by mouth every 8 (eight) hours as needed. (Patient not taking: Reported on 11/07/2024)     No facility-administered medications prior to visit.    PAST MEDICAL HISTORY: Past Medical History:  Diagnosis Date   Allergy    Carpal tunnel syndrome, bilateral upper limbs    Daily headache    here lately (08/16/2013)   DM (diabetes mellitus) (HCC)    GERD (gastroesophageal reflux disease)    Hyperlipidemia    Diagnosed 2004   Hypertension    Diagnosed 2000   Lateral femoral cutaneous neuropathy    Sleep apnea    dx'd; haven't took the sleep study yet (08/16/2013)   Sleep apnea    CPAP   Stroke (HCC)     PAST SURGICAL HISTORY: Past Surgical History:  Procedure Laterality Date   LYMPH NODE BIOPSY  1990's   throat     FAMILY HISTORY: Family History  Problem Relation Age of Onset   Diabetes Mother    COPD Mother    Seizures Mother    Asthma Mother    Heart failure Mother    Emphysema Mother    GER disease Mother    Cancer Mother    Heart  disease Mother    Heart disease Father 62       1st MI at 64, total 8 stents   Hypertension Father    Diabetes Father    Hyperlipidemia Father    GER disease Father    Heart disease Brother 66       2 blockages, no stents as of 2014   Kidney disease Brother    Diabetes Brother    Stroke Maternal Grandmother    Lung cancer Maternal Grandfather    COPD Maternal Grandfather  Emphysema Maternal Grandfather    Stroke Paternal Grandmother    Diabetes Paternal Grandfather     SOCIAL HISTORY: Social History   Socioeconomic History   Marital status: Married    Spouse name: Not on file   Number of children: 0   Years of education: Not on file   Highest education level: 8th grade  Occupational History   Occupation: Sports Administrator: [employer]  Tobacco Use   Smoking status: Former    Current packs/day: 0.00    Types: Cigarettes    Start date: 07/18/2002    Quit date: 07/19/2003    Years since quitting: 21.3   Smokeless tobacco: Current    Types: Chew   Tobacco comments:    08/16/2013 pack of cigarettes lasted me a month; dips 2 cans/day  Vaping Use   Vaping status: Never Used  Substance and Sexual Activity   Alcohol use: Not Currently    Comment: occ   Drug use: Never   Sexual activity: Yes  Other Topics Concern   Not on file  Social History Narrative   Pt lives with wife.    Right handed   Caffeine 40oz daily   Social Drivers of Health   Tobacco Use: High Risk (11/07/2024)   Patient History    Smoking Tobacco Use: Former    Smokeless Tobacco Use: Current    Passive Exposure: Not on file  Financial Resource Strain: Low Risk (09/12/2024)   Overall Financial Resource Strain (CARDIA)    Difficulty of Paying Living Expenses: Not very hard  Food Insecurity: No Food Insecurity (09/12/2024)   Epic    Worried About Programme Researcher, Broadcasting/film/video in the Last Year: Never true    Ran Out of Food in the Last Year: Never true  Transportation Needs: No Transportation Needs  (09/11/2024)   Epic    Lack of Transportation (Medical): No    Lack of Transportation (Non-Medical): No  Physical Activity: Inactive (09/12/2024)   Exercise Vital Sign    Days of Exercise per Week: 0 days    Minutes of Exercise per Session: 0 min  Stress: No Stress Concern Present (09/12/2024)   [employer] of Occupational Health - Occupational Stress Questionnaire    Feeling of Stress: Not at all  Social Connections: Moderately Isolated (09/12/2024)   Social Connection and Isolation Panel    Frequency of Communication with Friends and Family: More than three times a week    Frequency of Social Gatherings with Friends and Family: More than three times a week    Attends Religious Services: Patient declined    Active Member of Clubs or Organizations: No    Attends Banker Meetings: Never    Marital Status: Married  Catering Manager Violence: Not At Risk (09/12/2024)   Epic    Fear of Current or Ex-Partner: No    Emotionally Abused: No    Physically Abused: No    Sexually Abused: No  Depression (PHQ2-9): Low Risk (09/12/2024)   Depression (PHQ2-9)    PHQ-2 Score: 3  Alcohol Screen: Low Risk (09/12/2024)   Alcohol Screen    Last Alcohol Screening Score (AUDIT): 1  Housing: Low Risk (09/12/2024)   Epic    Unable to Pay for Housing in the Last Year: No    Number of Times Moved in the Last Year: 0    Homeless in the Last Year: No  Utilities: Not At Risk (09/12/2024)   Epic    Threatened with loss of  utilities: No  Health Literacy: Adequate Health Literacy (09/12/2024)   B1300 Health Literacy    Frequency of need for help with medical instructions: Rarely  Recent Concern: Health Literacy - Medium Risk (08/20/2024)   Received from [facility]    How often do you need to have someone help you when you read instructions, pamphlets, or other written material from your doctor or pharmacy?: Sometimes    PHYSICAL EXAM  GENERAL  EXAM/CONSTITUTIONAL: Vitals:  Vitals:   11/07/24 1535  BP: (!) 139/94  Pulse: 90  SpO2: 97%  Weight: 296 lb (134.3 kg)  Height: 5' 4 (1.626 m)   Body mass index is 50.81 kg/m. Wt Readings from Last 3 Encounters:  11/07/24 296 lb (134.3 kg)  10/10/24 (!) 303 lb (137.4 kg)  09/12/24 (!) 303 lb 0.6 oz (137.5 kg)   Patient is in no distress; well developed, nourished and groomed; neck is supple, Obese gentleman   MUSCULOSKELETAL: Gait, strength, tone, movements noted in Neurologic exam below  NEUROLOGIC: MENTAL STATUS:      No data to display         awake, alert, oriented to person, place and time recent and remote memory intact normal attention and concentration language fluent, comprehension intact, naming intact fund of knowledge appropriate  CRANIAL NERVE:  2nd, 3rd, 4th, 6th - Visual fields full to confrontation, extraocular muscles intact, no nystagmus 5th - facial sensation symmetric 7th - facial strength symmetric 8th - hearing intact 9th - palate elevates symmetrically, uvula midline 11th - shoulder shrug symmetric 12th - tongue protrusion midline  MOTOR:  normal bulk and tone, full strength in the BUE, BLE  SENSORY:  normal and symmetric to light touch  COORDINATION:  finger-nose-finger, fine finger movements normal  GAIT/STATION:  normal   DIAGNOSTIC DATA (LABS, IMAGING, TESTING) - I reviewed patient records, labs, notes, testing and imaging myself where available.  Lab Results  Component Value Date   WBC 9.9 04/18/2017   HGB 13.8 04/18/2017   HCT 41.2 04/18/2017   MCV 85.3 04/18/2017   PLT 166 04/18/2017      Component Value Date/Time   NA 139 02/08/2023 1126   K 4.7 02/08/2023 1126   CL 101 02/08/2023 1126   CO2 25 02/08/2023 1126   GLUCOSE 97 02/08/2023 1126   GLUCOSE 144 (H) 04/18/2017 2345   BUN 15 02/08/2023 1126   CREATININE 0.94 02/08/2023 1126   CALCIUM  10.3 (H) 02/08/2023 1126   PROT 7.2 10/10/2024 1053   ALBUMIN 4.7  10/10/2024 1053   AST 24 10/10/2024 1053   ALT 51 (H) 10/10/2024 1053   ALKPHOS 96 10/10/2024 1053   BILITOT 0.5 10/10/2024 1053   GFRNONAA >60 04/18/2017 2345   GFRAA >60 04/18/2017 2345   Lab Results  Component Value Date   CHOL 252 (H) 10/10/2024   HDL 40 10/10/2024   LDLCALC 177 (H) 10/10/2024   LDLDIRECT 196 (H) 03/13/2008   TRIG 188 (H) 10/10/2024   CHOLHDL 6.3 (H) 10/10/2024   Lab Results  Component Value Date   HGBA1C 5.3 08/16/2013   No results found for: VITAMINB12 Lab Results  Component Value Date   TSH 1.224 08/16/2013    CT Head 01/03/2023 Age indeterminate lacunar infarcts within the right thalamus and left basal ganglia, new since prior examination as noted on CT arteriogram performed at 6:19 a.m.SABRA If there is clinical concern for acute infarction, MRI examination would be helpful for further evaluation.   CTA Head  and Neck 01/03/2023:  1. No emergent finding.  2. 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   CTA Head and Neck 08/20/2024 1. No evidence of acute intracranial abnormality. 2. No large vessel occlusion or significant stenosis.  HgA1C 6.2  LDL 111  ASSESSMENT AND PLAN  39 y.o. year old male with history of hypertension, hyperlipidemia, obesity, prediabetes, previous stroke, right thalamus and left basal ganglia who is presenting for follow up for headaches and new left-sided weakness.  For his headaches, he tells me that he continues to have headaches 2-4 times a week, per description likely tension type headache.  Will start him on propranolol  as preventative and he will continue with Aleve and Tylenol  as needed but no more than 3 days a week. For his complaints of left-sided weakness, his CT head, CT angiogram did not show any acute abnormality.  He was recommended an MRI but has not completed.  We will order a MRI Brain, patient requests an open MRI to rule out any new strokes.  I will contact him to go  over the results.  I will see him in 6 months for follow-up but he understands to contact me with any new updates.   1. Chronic tension-type headache, not intractable   2. TIA (transient ischemic attack)   3. 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 Please call if you are experiencing any side effects from the medication. Continue you other medications  Will arrange for an open MRI to rule out additional stroke  Return in 6 months or sooner if worse   Orders Placed This Encounter  Procedures   MR BRAIN WO CONTRAST    Meds ordered this encounter  Medications   propranolol  ER (INDERAL  LA) 60 MG 24 hr capsule    Sig: Take 1 capsule (60 mg total) by mouth daily.    Dispense:  30 capsule    Refill:  0    Return in about 6 months (around 05/07/2025).    Pastor Falling, MD 11/07/2024, 4:27 PM  [facility] [address] [place], [place] [address] [phone]  "

## 2024-11-07 NOTE — Patient Instructions (Addendum)
 Will start patient on Propranolol  XR 60 mg nightly. Please call if you are experiencing any side effects from the medication. Continue you other medications  Will arrange for an open MRI to rule out additional stroke  Return in 6 months or sooner if worse

## 2024-11-07 NOTE — Telephone Encounter (Signed)
 no auth required sent to [facility] for an open MRI. [phone]

## 2025-03-15 ENCOUNTER — Ambulatory Visit: Payer: Self-pay

## 2025-04-04 ENCOUNTER — Ambulatory Visit: Payer: PRIVATE HEALTH INSURANCE | Admitting: Neurology
